# Patient Record
Sex: Female | Born: 1960 | Race: White | Hispanic: No | Marital: Married | Smoking: Never smoker
Health system: Southern US, Community
[De-identification: ages and names within clinical notes are randomized; demographics above are authoritative.]

## PROBLEM LIST (undated history)

## (undated) DIAGNOSIS — I10 Essential (primary) hypertension: Secondary | ICD-10-CM

## (undated) DIAGNOSIS — M1990 Unspecified osteoarthritis, unspecified site: Secondary | ICD-10-CM

## (undated) DIAGNOSIS — Z803 Family history of malignant neoplasm of breast: Secondary | ICD-10-CM

## (undated) DIAGNOSIS — H04123 Dry eye syndrome of bilateral lacrimal glands: Secondary | ICD-10-CM

## (undated) DIAGNOSIS — Z8041 Family history of malignant neoplasm of ovary: Secondary | ICD-10-CM

## (undated) DIAGNOSIS — Z801 Family history of malignant neoplasm of trachea, bronchus and lung: Secondary | ICD-10-CM

## (undated) DIAGNOSIS — R3129 Other microscopic hematuria: Secondary | ICD-10-CM

## (undated) DIAGNOSIS — Z800 Family history of malignant neoplasm of digestive organs: Secondary | ICD-10-CM

## (undated) DIAGNOSIS — Z8052 Family history of malignant neoplasm of bladder: Secondary | ICD-10-CM

## (undated) HISTORY — PX: TOTAL HIP ARTHROPLASTY: SHX124

## (undated) HISTORY — PX: COLONOSCOPY: SHX174

## (undated) HISTORY — DX: Unspecified osteoarthritis, unspecified site: M19.90

## (undated) HISTORY — DX: Family history of malignant neoplasm of trachea, bronchus and lung: Z80.1

## (undated) HISTORY — DX: Family history of malignant neoplasm of bladder: Z80.52

## (undated) HISTORY — DX: Family history of malignant neoplasm of ovary: Z80.41

## (undated) HISTORY — PX: WISDOM TOOTH EXTRACTION: SHX21

## (undated) HISTORY — DX: Dry eye syndrome of bilateral lacrimal glands: H04.123

## (undated) HISTORY — DX: Family history of malignant neoplasm of breast: Z80.3

## (undated) HISTORY — DX: Essential (primary) hypertension: I10

## (undated) HISTORY — DX: Other microscopic hematuria: R31.29

## (undated) HISTORY — DX: Family history of malignant neoplasm of digestive organs: Z80.0

---

## 1999-08-02 ENCOUNTER — Encounter: Payer: Self-pay | Admitting: Occupational Medicine

## 1999-08-02 ENCOUNTER — Ambulatory Visit: Admission: RE | Admit: 1999-08-02 | Discharge: 1999-08-02 | Payer: Self-pay | Admitting: Occupational Medicine

## 2000-08-05 ENCOUNTER — Other Ambulatory Visit: Admission: RE | Admit: 2000-08-05 | Discharge: 2000-08-05 | Payer: Self-pay | Admitting: Obstetrics and Gynecology

## 2002-08-11 ENCOUNTER — Other Ambulatory Visit: Admission: RE | Admit: 2002-08-11 | Discharge: 2002-08-11 | Payer: Self-pay | Admitting: Obstetrics and Gynecology

## 2002-11-11 ENCOUNTER — Other Ambulatory Visit: Admission: RE | Admit: 2002-11-11 | Discharge: 2002-11-11 | Payer: Self-pay | Admitting: Obstetrics and Gynecology

## 2003-08-13 ENCOUNTER — Other Ambulatory Visit: Admission: RE | Admit: 2003-08-13 | Discharge: 2003-08-13 | Payer: Self-pay | Admitting: Obstetrics and Gynecology

## 2004-02-22 ENCOUNTER — Encounter: Admission: RE | Admit: 2004-02-22 | Discharge: 2004-02-22 | Payer: Self-pay | Admitting: Obstetrics and Gynecology

## 2004-08-15 ENCOUNTER — Other Ambulatory Visit: Admission: RE | Admit: 2004-08-15 | Discharge: 2004-08-15 | Payer: Self-pay | Admitting: Obstetrics and Gynecology

## 2005-04-04 ENCOUNTER — Encounter: Admission: RE | Admit: 2005-04-04 | Discharge: 2005-04-04 | Payer: Self-pay | Admitting: Obstetrics and Gynecology

## 2006-01-25 ENCOUNTER — Other Ambulatory Visit: Admission: RE | Admit: 2006-01-25 | Discharge: 2006-01-25 | Payer: Self-pay | Admitting: Obstetrics and Gynecology

## 2006-05-07 ENCOUNTER — Encounter: Admission: RE | Admit: 2006-05-07 | Discharge: 2006-05-07 | Payer: Self-pay | Admitting: Obstetrics and Gynecology

## 2007-01-29 ENCOUNTER — Other Ambulatory Visit: Admission: RE | Admit: 2007-01-29 | Discharge: 2007-01-29 | Payer: Self-pay | Admitting: Obstetrics and Gynecology

## 2007-05-29 ENCOUNTER — Encounter: Admission: RE | Admit: 2007-05-29 | Discharge: 2007-05-29 | Payer: Self-pay | Admitting: Obstetrics and Gynecology

## 2008-02-09 ENCOUNTER — Other Ambulatory Visit: Admission: RE | Admit: 2008-02-09 | Discharge: 2008-02-09 | Payer: Self-pay | Admitting: Obstetrics and Gynecology

## 2008-05-31 ENCOUNTER — Encounter: Admission: RE | Admit: 2008-05-31 | Discharge: 2008-05-31 | Payer: Self-pay | Admitting: Obstetrics and Gynecology

## 2009-06-07 ENCOUNTER — Encounter: Admission: RE | Admit: 2009-06-07 | Discharge: 2009-06-07 | Payer: Self-pay | Admitting: Obstetrics and Gynecology

## 2010-06-08 ENCOUNTER — Encounter: Admission: RE | Admit: 2010-06-08 | Discharge: 2010-06-08 | Payer: Self-pay | Admitting: Obstetrics and Gynecology

## 2010-09-03 DIAGNOSIS — R3129 Other microscopic hematuria: Secondary | ICD-10-CM

## 2010-09-03 HISTORY — DX: Other microscopic hematuria: R31.29

## 2011-05-01 ENCOUNTER — Other Ambulatory Visit: Payer: Self-pay | Admitting: Obstetrics and Gynecology

## 2011-05-01 DIAGNOSIS — Z1231 Encounter for screening mammogram for malignant neoplasm of breast: Secondary | ICD-10-CM

## 2011-06-11 ENCOUNTER — Ambulatory Visit
Admission: RE | Admit: 2011-06-11 | Discharge: 2011-06-11 | Disposition: A | Discharge status: Home / Self Care | Payer: BC Managed Care – PPO | Source: Ambulatory Visit | Attending: Obstetrics and Gynecology | Admitting: Obstetrics and Gynecology

## 2011-06-11 DIAGNOSIS — Z1231 Encounter for screening mammogram for malignant neoplasm of breast: Secondary | ICD-10-CM

## 2012-06-02 ENCOUNTER — Other Ambulatory Visit: Payer: Self-pay | Admitting: Obstetrics and Gynecology

## 2012-06-02 DIAGNOSIS — Z1231 Encounter for screening mammogram for malignant neoplasm of breast: Secondary | ICD-10-CM

## 2012-06-27 ENCOUNTER — Ambulatory Visit
Admission: RE | Admit: 2012-06-27 | Discharge: 2012-06-27 | Disposition: A | Discharge status: Home / Self Care | Payer: BC Managed Care – PPO | Source: Ambulatory Visit | Attending: Obstetrics and Gynecology | Admitting: Obstetrics and Gynecology

## 2012-06-27 DIAGNOSIS — Z1231 Encounter for screening mammogram for malignant neoplasm of breast: Secondary | ICD-10-CM

## 2013-05-15 ENCOUNTER — Ambulatory Visit: Payer: Self-pay | Admitting: Obstetrics and Gynecology

## 2013-05-15 ENCOUNTER — Encounter: Payer: Self-pay | Admitting: Gynecology

## 2013-05-15 ENCOUNTER — Encounter: Payer: Self-pay | Admitting: Obstetrics and Gynecology

## 2013-05-15 ENCOUNTER — Ambulatory Visit (INDEPENDENT_AMBULATORY_CARE_PROVIDER_SITE_OTHER): Payer: BC Managed Care – PPO | Admitting: Gynecology

## 2013-05-15 VITALS — BP 110/80 | HR 80 | Resp 12 | Ht 64.0 in | Wt 154.0 lb

## 2013-05-15 DIAGNOSIS — Z79890 Hormone replacement therapy (postmenopausal): Secondary | ICD-10-CM

## 2013-05-15 DIAGNOSIS — R3129 Other microscopic hematuria: Secondary | ICD-10-CM

## 2013-05-15 DIAGNOSIS — Z01419 Encounter for gynecological examination (general) (routine) without abnormal findings: Secondary | ICD-10-CM

## 2013-05-15 DIAGNOSIS — R311 Benign essential microscopic hematuria: Secondary | ICD-10-CM | POA: Insufficient documentation

## 2013-05-15 DIAGNOSIS — I10 Essential (primary) hypertension: Secondary | ICD-10-CM

## 2013-05-15 DIAGNOSIS — Z124 Encounter for screening for malignant neoplasm of cervix: Secondary | ICD-10-CM

## 2013-05-15 NOTE — Patient Instructions (Signed)

## 2013-05-15 NOTE — Progress Notes (Signed)
52 y.o. Married Caucasian female   G2P2002 here for annual exam. Pt reports menses are absent.  She does report hot flashes, does have night sweats, does not have vaginal dryness.  She is not using lubricants.  She does not report post-menopasual bleeding.  She is happy with estrogen/progestin combination.  Pt still sees urology regarding microscopic hematuria.    LMP: 04/04/2011       Sexually active: no  The current method of family planning is none.    Exercising: yes  The patient does not participate in regular exercise at present. Last pap: 03/15/2010 Abnormal PAP: years ago Mammogram: 06/27/12 BSE: sometimes  Colonoscopy: 2010- Normal  DEXA: yes, cannot remember Alcohol: less than 1- occ Tobacco: no  Hgb: PCP ; Urine: PCP  Health Maintenance  Topic Date Due  . Pap Smear  06/04/1977  . Tetanus/tdap  06/04/1978  . Colonoscopy  06/04/2009  . Influenza Vaccine  04/03/2013  . Mammogram  06/27/2014    Family History  Problem Relation Age of Onset  . Bladder Cancer Mother 45    There are no active problems to display for this patient.   Past Medical History  Diagnosis Date  . Hypertension   . Microscopic hematuria 2012    w/u -    No past surgical history on file.  Allergies: Review of patient's allergies indicates no known allergies.  Current Outpatient Prescriptions  Medication Sig Dispense Refill  . Cyanocobalamin (B-12 SL) Place under the tongue.      . CycloSPORINE (RESTASIS OP) Apply to eye.      . Glucosamine HCl (GLUCOSAMINE PO) Take by mouth.      . losartan (COZAAR) 50 MG tablet Take 50 mg by mouth daily.      Marland Kitchen UNABLE TO FIND Med Name: Ultra Flora Plus      . estradiol (ESTRACE) 1 MG tablet daily.      . progesterone (PROMETRIUM) 100 MG capsule daily.       No current facility-administered medications for this visit.    ROS: Pertinent items are noted in HPI.  Exam:    There were no vitals taken for this visit. Weight change: @WEIGHTCHANGE @ Last 3  height recordings:  Ht Readings from Last 3 Encounters:  No data found for Ht   General appearance: alert, cooperative and appears stated age Head: Normocephalic, without obvious abnormality, atraumatic Neck: no adenopathy, no carotid bruit, no JVD, supple, symmetrical, trachea midline and thyroid not enlarged, symmetric, no tenderness/mass/nodules Lungs: clear to auscultation bilaterally Breasts: normal appearance, no masses or tenderness Heart: regular rate and rhythm, S1, S2 normal, no murmur, click, rub or gallop Abdomen: soft, non-tender; bowel sounds normal; no masses,  no organomegaly Extremities: extremities normal, atraumatic, no cyanosis or edema Skin: Skin color, texture, turgor normal. No rashes or lesions Lymph nodes: Cervical, supraclavicular, and axillary nodes normal. no inguinal nodes palpated Neurologic: Grossly normal   Pelvic: External genitalia:  no lesions              Urethra: normal appearing urethra with no masses, tenderness or lesions              Bartholins and Skenes: normal                 Vagina: normal appearing vagina with normal color and discharge, no lesions              Cervix: normal appearance and nabothian cysts  Pap taken: yes        Bimanual Exam:  Uterus:  uterus is normal size, shape, consistency and nontender                                      Adnexa:    normal adnexa in size, nontender and no masses                                      Rectovaginal: Confirms                                      Anus:  defer exam  A: well woman no contraindication to continue hormonal therapy      P: mammogram pap smear with [facility] counseled on breast self exam, mammography screening, use and side effects of HRT.  Pt interestred in trying duavee, samples given, discussed difference in this Rx compared to estrogen/progestin combination she is on.  Pt aware of increased breast cancer seen in HRT group of [facility] compared to ERT group, pt to call  with rx request return annually or prn Discussed PAP guideline changes, importance of weight bearing exercises, calcium, vit D and balanced diet.  An After Visit Summary was printed and given to the patient.

## 2013-05-19 LAB — IPS PAP TEST WITH HPV

## 2013-05-27 DIAGNOSIS — Z79890 Hormone replacement therapy (postmenopausal): Secondary | ICD-10-CM | POA: Insufficient documentation

## 2013-05-27 DIAGNOSIS — E785 Hyperlipidemia, unspecified: Secondary | ICD-10-CM | POA: Insufficient documentation

## 2013-06-01 ENCOUNTER — Other Ambulatory Visit: Payer: Self-pay

## 2013-06-01 DIAGNOSIS — Z1231 Encounter for screening mammogram for malignant neoplasm of breast: Secondary | ICD-10-CM

## 2013-06-16 ENCOUNTER — Other Ambulatory Visit: Payer: Self-pay | Admitting: Obstetrics and Gynecology

## 2013-06-29 ENCOUNTER — Ambulatory Visit
Admission: RE | Admit: 2013-06-29 | Discharge: 2013-06-29 | Disposition: A | Discharge status: Home / Self Care | Payer: BC Managed Care – PPO | Source: Ambulatory Visit

## 2013-06-29 DIAGNOSIS — Z1231 Encounter for screening mammogram for malignant neoplasm of breast: Secondary | ICD-10-CM

## 2013-07-09 ENCOUNTER — Other Ambulatory Visit: Payer: Self-pay

## 2013-07-25 ENCOUNTER — Encounter: Payer: Self-pay | Admitting: Gynecology

## 2013-07-27 ENCOUNTER — Other Ambulatory Visit: Payer: Self-pay | Admitting: Gynecology

## 2013-07-27 MED ORDER — CONJ ESTROGENS-BAZEDOXIFENE 0.45-20 MG PO TABS: 1.0000 | ORAL_TABLET | ORAL | Status: DC

## 2013-12-18 ENCOUNTER — Telehealth: Payer: Self-pay | Admitting: Gynecology

## 2013-12-18 ENCOUNTER — Ambulatory Visit (INDEPENDENT_AMBULATORY_CARE_PROVIDER_SITE_OTHER): Payer: BC Managed Care – PPO | Admitting: Certified Nurse Midwife

## 2013-12-18 ENCOUNTER — Encounter: Payer: Self-pay | Admitting: Certified Nurse Midwife

## 2013-12-18 VITALS — BP 120/64 | HR 70 | Temp 98.3°F | Resp 16 | Ht 64.0 in | Wt 157.0 lb

## 2013-12-18 DIAGNOSIS — N390 Urinary tract infection, site not specified: Secondary | ICD-10-CM

## 2013-12-18 LAB — POCT URINALYSIS DIPSTICK
BILIRUBIN UA: NEGATIVE
Glucose, UA: NEGATIVE
KETONES UA: NEGATIVE
LEUKOCYTES UA: NEGATIVE
Nitrite, UA: POSITIVE
PH UA: 5
Protein, UA: NEGATIVE
Urobilinogen, UA: NEGATIVE

## 2013-12-18 MED ORDER — NITROFURANTOIN MONOHYD MACRO 100 MG PO CAPS: 100.0000 mg | ORAL_CAPSULE | Freq: Two times a day (BID) | ORAL | Status: DC

## 2013-12-18 NOTE — Patient Instructions (Signed)
Urinary Tract Infection  Urinary tract infections (UTIs) can develop anywhere along your urinary tract. Your urinary tract is your body's drainage system for removing wastes and extra water. Your urinary tract includes two kidneys, two ureters, a bladder, and a urethra. Your kidneys are a pair of bean-shaped organs. Each kidney is about the size of your fist. They are located below your ribs, one on each side of your spine.  CAUSES  Infections are caused by microbes, which are microscopic organisms, including fungi, viruses, and bacteria. These organisms are so small that they can only be seen through a microscope. Bacteria are the microbes that most commonly cause UTIs.  SYMPTOMS   Symptoms of UTIs may vary by age and gender of the patient and by the location of the infection. Symptoms in young women typically include a frequent and intense urge to urinate and a painful, burning feeling in the bladder or urethra during urination. Older women and men are more likely to be tired, shaky, and weak and have muscle aches and abdominal pain. A fever may mean the infection is in your kidneys. Other symptoms of a kidney infection include pain in your back or sides below the ribs, nausea, and vomiting.  DIAGNOSIS  To diagnose a UTI, your caregiver will ask you about your symptoms. Your caregiver also will ask to provide a urine sample. The urine sample will be tested for bacteria and white blood cells. White blood cells are made by your body to help fight infection.  TREATMENT   Typically, UTIs can be treated with medication. Because most UTIs are caused by a bacterial infection, they usually can be treated with the use of antibiotics. The choice of antibiotic and length of treatment depend on your symptoms and the type of bacteria causing your infection.  HOME CARE INSTRUCTIONS   If you were prescribed antibiotics, take them exactly as your caregiver instructs you. Finish the medication even if you feel better after you  have only taken some of the medication.   Drink enough water and fluids to keep your urine clear or pale yellow.   Avoid caffeine, tea, and carbonated beverages. They tend to irritate your bladder.   Empty your bladder often. Avoid holding urine for long periods of time.   Empty your bladder before and after sexual intercourse.   After a bowel movement, women should cleanse from front to back. Use each tissue only once.  SEEK MEDICAL CARE IF:    You have back pain.   You develop a fever.   Your symptoms do not begin to resolve within 3 days.  SEEK IMMEDIATE MEDICAL CARE IF:    You have severe back pain or lower abdominal pain.   You develop chills.   You have nausea or vomiting.   You have continued burning or discomfort with urination.  MAKE SURE YOU:    Understand these instructions.   Will watch your condition.   Will get help right away if you are not doing well or get worse.  Document Released: 05/30/2005 Document Revised: 02/19/2012 Document Reviewed: 09/28/2011  Exit[employer] Patient Information 2014 [employer].

## 2013-12-18 NOTE — Progress Notes (Signed)
S:  53 y.o.Married Caucasian female presents with complaint of UTI. Symptoms began this am. With symptoms of burning with urination, dysuria, urinary frequency, urinary urgency. . Pertinent negatives include The patient is having no constitutional symptoms, denying fever, chills, anorexia, or weight loss.. Sexually active yes  Symptoms related to post coital No . Menopausal yes Vaginal dryness yes Same partner without change. Last UTI was E. Coli treated with Macrobid in 2009 per chart. No new personal products or vaginal dryness.  ROS: fatigued  O alert, oriented to person, place, and time   healthy, well developed and well nourished  Skin warm and dry  Mild supra pubic tenderness  No CVA tenderness bilateral  Bladder, urethra, urethral meatus tender  cervix normal in appearance, external genitalia normal, no adnexal masses or tenderness, uterus normal size, shape, and consistency and vagina normal without discharge   Diagnostic Test:    Poct urine-rbc-tr, nitrites-positive  Assessment:UTI  P: Medications: Rx Macrobid see order. Maintain adequate hydration. Follow up if symptoms not improving, and as needed.   Lab:TOC if Urine Culture is positive 2 weeks   Rv as above, prn

## 2013-12-18 NOTE — Telephone Encounter (Signed)
error 

## 2013-12-19 LAB — URINALYSIS, MICROSCOPIC ONLY
Bacteria, UA: NONE SEEN
CASTS: NONE SEEN
Crystals: NONE SEEN
Squamous Epithelial / LPF: NONE SEEN

## 2013-12-21 LAB — URINE CULTURE: Colony Count: >=100000

## 2013-12-28 NOTE — Progress Notes (Signed)
Reviewed personally.  M. Suzanne Eshani Maestre, MD.  

## 2014-01-05 ENCOUNTER — Ambulatory Visit (INDEPENDENT_AMBULATORY_CARE_PROVIDER_SITE_OTHER): Payer: BC Managed Care – PPO | Admitting: Certified Nurse Midwife

## 2014-01-05 ENCOUNTER — Encounter: Payer: Self-pay | Admitting: Certified Nurse Midwife

## 2014-01-05 VITALS — BP 130/80 | HR 80 | Temp 98.7°F | Resp 16 | Ht 64.0 in | Wt 152.4 lb

## 2014-01-05 DIAGNOSIS — N390 Urinary tract infection, site not specified: Secondary | ICD-10-CM

## 2014-01-05 LAB — POCT URINALYSIS DIPSTICK
Bilirubin, UA: NEGATIVE
Glucose, UA: NEGATIVE
KETONES UA: NEGATIVE
Leukocytes, UA: NEGATIVE
Nitrite, UA: NEGATIVE
PROTEIN UA: NEGATIVE
Urobilinogen, UA: NEGATIVE
pH, UA: 5

## 2014-01-05 NOTE — Progress Notes (Signed)
Reviewed personally.  M. Suzanne Azie Mcconahy, MD.  

## 2014-01-05 NOTE — Progress Notes (Signed)
53 y.o. Married Caucasian female G2P2002 here for follow up of Klebsiella UTI treated with Macrobid initiated on 12/18/13. Completed all medication as directed.  Denies any symptoms of urinary frequency, urgency or pain. Denies vaginal itching, burning or abnormal discharge. Still occasional "twinge", but no pain. No vaginal dryness.    O: Healthy WD,WN female Affect: normal Skin:warm and dry Abdomen:negative for suprapubic tenderness CVAT: negative bilateral Pelvic exam:EXTERNAL GENITALIA: normal appearing vulva with no masses, tenderness or lesions Bladder/urethra/urethral meatus non tender VAGINA: no abnormal discharge or lesions and scant moisture noted with slight increase pink at vaginal introitus, non tender  A:UTI probably resolved  Vaginal dryness   P: Discussed findings of no indication of continued UTI, but will send lab to confirm. Continue to increase water daily. Reviewed findings of slight vaginal dryness and need to use vaginal moisture 2 x weekly to increase vaginal moisture. Discussed option of OTC, Olive Oil. Patient would like to try Olive Oil and will advise if no change. Poct urine-rbc tr Labs Urine micro/culture    RV prn

## 2014-01-06 LAB — URINE CULTURE

## 2014-01-06 LAB — URINALYSIS, MICROSCOPIC ONLY
BACTERIA UA: NONE SEEN
CASTS: NONE SEEN
CRYSTALS: NONE SEEN
SQUAMOUS EPITHELIAL / LPF: NONE SEEN

## 2014-05-21 ENCOUNTER — Ambulatory Visit: Payer: BC Managed Care – PPO | Admitting: Gynecology

## 2014-06-09 ENCOUNTER — Encounter: Payer: Self-pay | Admitting: Gynecology

## 2014-06-09 ENCOUNTER — Ambulatory Visit (INDEPENDENT_AMBULATORY_CARE_PROVIDER_SITE_OTHER): Payer: BC Managed Care – PPO | Admitting: Gynecology

## 2014-06-09 VITALS — BP 136/70 | HR 72 | Ht 64.0 in | Wt 133.0 lb

## 2014-06-09 DIAGNOSIS — Z01419 Encounter for gynecological examination (general) (routine) without abnormal findings: Secondary | ICD-10-CM

## 2014-06-09 DIAGNOSIS — Z79890 Hormone replacement therapy (postmenopausal): Secondary | ICD-10-CM

## 2014-06-09 DIAGNOSIS — Z0000 Encounter for general adult medical examination without abnormal findings: Secondary | ICD-10-CM

## 2014-06-09 LAB — POCT URINALYSIS DIPSTICK
BILIRUBIN UA: NEGATIVE
Glucose, UA: NEGATIVE
KETONES UA: NEGATIVE
Leukocytes, UA: NEGATIVE
Nitrite, UA: NEGATIVE
PH UA: 5
Protein, UA: NEGATIVE
Urobilinogen, UA: NEGATIVE

## 2014-06-09 MED ORDER — CONJ ESTROGENS-BAZEDOXIFENE 0.45-20 MG PO TABS: 1.0000 | ORAL_TABLET | ORAL | Status: DC

## 2014-06-09 NOTE — Progress Notes (Signed)
53 y.o. married Caucasian female   G2P2002 here for annual exam. Pt reports no menses.  She does not report hot flashes, does not have night sweats, does not have vaginal dryness.  She is not using lubricants, .  She does not report post-menopasual bleeding  Patient's last menstrual period was 04/04/2011.          Sexually active: No.  The current method of family planning is post menopausal status.    Exercising: Yes.    Home exercise routine includes walking 3 mile 3 times a week. Last pap: 05/15/13 neg HR HPV Abnormal ZOX:WRUEAPAP:years ago Mammogram:07/02/13 Bi-Rads Neg VWU:[id number]:sometimes Colonoscopy: 2010-normal DEXA: yes, can't remember Alcohol:socially Tobacco:no  Health Maintenance  Topic Date Due  . Colonoscopy  06/04/2009  . Influenza Vaccine  04/03/2014  . Mammogram  06/30/2015  . Pap Smear  05/15/2016  . Tetanus/tdap  03/03/2019    Family History  Problem Relation Age of Onset  . Bladder Cancer Mother 4082  . Cancer Father   . Cancer Maternal Grandmother   . Cancer Maternal Grandfather   . Ovarian cancer Cousin   . Colon cancer Cousin   . Breast cancer Cousin     Patient Active Problem List   Diagnosis Date Noted  . Unspecified essential hypertension 05/15/2013  . Benign microscopic hematuria 05/15/2013    Past Medical History  Diagnosis Date  . Hypertension   . Microscopic hematuria 2012    w/u -    History reviewed. No pertinent past surgical history.  Allergies: Review of patient's allergies indicates no known allergies.  Current Outpatient Prescriptions  Medication Sig Dispense Refill  . Conj Estrogens-Bazedoxifene (DUAVEE) 0.45-20 MG TABS Take 1 tablet by mouth 1 day or 1 dose.  30 tablet  11  . Cyanocobalamin (B-12 SL) Place under the tongue.      . CycloSPORINE (RESTASIS OP) Apply to eye.      . Glucosamine HCl (GLUCOSAMINE PO) Take by mouth.      . losartan (COZAAR) 50 MG tablet Take 50 mg by mouth daily.      Marland Kitchen. UNABLE TO FIND Med Name: Ultra Flora Plus        No current facility-administered medications for this visit.    ROS: Pertinent items are noted in HPI.  Exam:    BP 136/70  Pulse 72  Ht 5\' 4"  (1.626 m)  Wt 133 lb (60.328 kg)  BMI 22.82 kg/m2  LMP 04/04/2011 Weight change: @WEIGHTCHANGE @ Last 3 height recordings:  Ht Readings from Last 3 Encounters:  06/09/14 5\' 4"  (1.626 m)  01/05/14 5\' 4"  (1.626 m)  12/18/13 5\' 4"  (1.626 m)   General appearance: alert, cooperative and appears stated age Head: Normocephalic, without obvious abnormality, atraumatic Neck: no adenopathy, no carotid bruit, no JVD, supple, symmetrical, trachea midline and thyroid not enlarged, symmetric, no tenderness/mass/nodules Lungs: clear to auscultation bilaterally Breasts: Inspection negative, No nipple retraction or dimpling, No nipple discharge or bleeding, No axillary or supraclavicular adenopathy, Normal to palpation without dominant masses Heart: regular rate and rhythm, S1, S2 normal, no murmur, click, rub or gallop Abdomen: soft, non-tender; bowel sounds normal; no masses,  no organomegaly Extremities: extremities normal, atraumatic, no cyanosis or edema Skin: Skin color, texture, turgor normal. No rashes or lesions Lymph nodes: Cervical, supraclavicular, and axillary nodes normal. no inguinal nodes palpated Neurologic: Grossly normal   Pelvic: External genitalia:  no lesions              Urethra: normal appearing urethra with  no masses, tenderness or lesions              Bartholins and Skenes: Bartholin's, Urethra, Skene's normal                 Vagina: normal appearing vagina with normal color and discharge, no lesions              Cervix: normal appearance              Pap taken: No.        Bimanual Exam:  Uterus:  uterus is normal size, shape, consistency and nontender                                      Adnexa:    normal adnexa in size, nontender and no masses                                      Rectovaginal: Confirms                                       Anus:  normal sphincter tone, no lesions      1. Laboratory exam ordered as part of routine general medical examination  - POCT urinalysis dipstick  2. Encounter for routine gynecological examination   mammogram Pt lost 30# with weight watchers Would like to continue with HRT-risks and benefits reviewe counseled on breast self exam, mammography screening, adequate intake of calcium and vitamin D, diet and exercise return annually or prn Discussed PAP guideline changes, importance of weight bearing exercises, calcium, vit D and balanced diet.  3. Hormone replacement: Refill duavee for 1y An After Visit Summary was printed and given to the patient.

## 2014-06-11 ENCOUNTER — Other Ambulatory Visit: Payer: Self-pay

## 2014-06-11 DIAGNOSIS — Z1231 Encounter for screening mammogram for malignant neoplasm of breast: Secondary | ICD-10-CM

## 2014-06-18 ENCOUNTER — Other Ambulatory Visit: Payer: Self-pay

## 2014-07-05 ENCOUNTER — Encounter: Payer: Self-pay | Admitting: Gynecology

## 2014-07-05 ENCOUNTER — Ambulatory Visit
Admission: RE | Admit: 2014-07-05 | Discharge: 2014-07-05 | Disposition: A | Discharge status: Home / Self Care | Payer: BC Managed Care – PPO | Source: Ambulatory Visit

## 2014-07-05 DIAGNOSIS — Z1231 Encounter for screening mammogram for malignant neoplasm of breast: Secondary | ICD-10-CM

## 2014-08-06 ENCOUNTER — Other Ambulatory Visit: Payer: Self-pay

## 2014-08-06 DIAGNOSIS — Z79890 Hormone replacement therapy (postmenopausal): Secondary | ICD-10-CM

## 2014-08-06 NOTE — Telephone Encounter (Signed)
Pt was given a year's refill on 06/09/14. Pt will call if any refills are needed

## 2014-08-11 ENCOUNTER — Other Ambulatory Visit: Payer: Self-pay | Admitting: Gynecology

## 2014-08-11 NOTE — Telephone Encounter (Signed)
[facility] states they have [facility] and have not heard back.   Fax: [phone] Phone: [phone]  bf

## 2014-08-11 NOTE — Telephone Encounter (Signed)
Medication refill request: Duavee Last AEX:  06/09/14 Next AEX: none Last MMG (if hormonal medication request): 07/05/14 BIRADS1: neg Refill authorized: 06/09/14 #30/11 [facility] [address]  Called pharmacy spoke Lurena JoinerRebecca she will call [facility] to transfer Rx.

## 2015-06-15 ENCOUNTER — Encounter: Payer: Self-pay | Admitting: Obstetrics and Gynecology

## 2015-06-15 ENCOUNTER — Ambulatory Visit (INDEPENDENT_AMBULATORY_CARE_PROVIDER_SITE_OTHER): Payer: BLUE CROSS/BLUE SHIELD | Admitting: Obstetrics and Gynecology

## 2015-06-15 VITALS — BP 120/80 | HR 60 | Resp 14 | Ht 64.25 in | Wt 144.0 lb

## 2015-06-15 DIAGNOSIS — Z01419 Encounter for gynecological examination (general) (routine) without abnormal findings: Secondary | ICD-10-CM

## 2015-06-15 DIAGNOSIS — Z79890 Hormone replacement therapy (postmenopausal): Secondary | ICD-10-CM

## 2015-06-15 MED ORDER — CONJ ESTROG-MEDROXYPROGEST ACE 0.3-1.5 MG PO TABS: 1.0000 | ORAL_TABLET | Freq: Every day | ORAL | Status: DC

## 2015-06-15 NOTE — Patient Instructions (Signed)

## 2015-06-15 NOTE — Progress Notes (Signed)
Patient ID: Kathryn Odom, female   DOB: 12-Feb-1961, 54 y.o.   MRN: [id number] 54 y.o. [id number] MarriedCaucasianF here for annual exam.   She is on duavee for vasomotor symptoms. Recently she has had a back injury was on steroids, when coming off of them she was having some vasomotor symptoms. Now fine. No vaginal bleeding. Some mild vaginal dryness. Not sexually active.     Patient's last menstrual period was 04/04/2011.          Sexually active: No.  The current method of family planning is post menopausal status.    Exercising: No.  The patient does not participate in regular exercise at present. Smoker:  no  Health Maintenance: Pap:  05-15-13 WNL NEG HR HPV History of abnormal Pap:  Yes years ago -repeat PAP was normal MMG:  07-05-14 WNL Colonoscopy:  2010 Normal BMD:   Yes but unsure when TDaP:  03-02-09  Gardasil: N/A   reports that she has never smoked. She has never used smokeless tobacco. She reports that she drinks alcohol. She reports that she does not use illicit drugs. Rare ETOH use. 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 2 daughters, youngest just finished college.   Past Medical History  Diagnosis Date  . Hypertension   . Microscopic hematuria 2012    w/u -  . Dry eyes   . Arthritis     History reviewed. No pertinent past surgical history.  Current Outpatient Prescriptions  Medication Sig Dispense Refill  . Conj Estrogens-Bazedoxifene (DUAVEE) 0.45-20 MG TABS Take 1 tablet by mouth 1 day or 1 dose. 30 tablet 11  . CycloSPORINE (RESTASIS OP) Apply to eye.    . Glucosamine HCl (GLUCOSAMINE PO) Take by mouth.    . losartan (COZAAR) 50 MG tablet Take 50 mg by mouth daily.    Marland Kitchen UNABLE TO FIND Med Name: Ultra Flora Plus     No current facility-administered medications for this visit.    Family History  Problem Relation Age of Onset  . Bladder Cancer Mother 45  . Lung cancer Father     smoker  . Cancer  Maternal Grandmother     liver  . Lung cancer Maternal Grandfather     smoker  . Ovarian cancer Cousin     paternal #1  . Colon cancer Cousin     paternal #2  . Breast cancer Cousin     maternal  3 cousins with brain aneurysms   Review of Systems  Constitutional: Negative.   HENT: Negative.   Eyes: Negative.   Respiratory: Negative.   Cardiovascular: Negative.   Gastrointestinal: Negative.   Endocrine: Negative.   Genitourinary: Negative.   Musculoskeletal: Negative.   Skin: Negative.   Allergic/Immunologic: Negative.   Neurological: Negative.   Psychiatric/Behavioral: Negative.     Exam:   BP 120/80 mmHg  Pulse 60  Resp 14  Ht 5' 4.25" (1.632 m)  Wt 144 lb (65.318 kg)  BMI 24.52 kg/m2  LMP 04/04/2011  Weight change: @ Height:   Height: 5' 4.25" (163.2 cm)  Ht Readings from Last 3 Encounters:  06/15/15 5' 4.25" (1.632 m)  06/09/14  (1.626 m)  01/05/14  (1.626 m)    General appearance: alert, cooperative and appears stated age Head: Normocephalic, without obvious abnormality, atraumatic Neck: no adenopathy, supple, symmetrical, trachea midline and thyroid normal to inspection and palpation Lungs: clear to auscultation bilaterally Breasts: normal appearance, no masses  or tenderness Heart: regular rate and rhythm Abdomen: soft, non-tender; bowel sounds normal; no masses,  no organomegaly Extremities: extremities normal, atraumatic, no cyanosis or edema Skin: Skin color, texture, turgor normal. No rashes or lesions Lymph nodes: Cervical, supraclavicular, and axillary nodes normal. No abnormal inguinal nodes palpated Neurologic: Grossly normal   Pelvic: External genitalia:  no lesions              Urethra:  normal appearing urethra with no masses, tenderness or lesions              Bartholins and Skenes: normal                 Vagina: normal appearing vagina with normal color and discharge, no lesions. Atrophic              Cervix: no  lesions               Bimanual Exam:  Uterus:  normal size, contour, position, consistency, mobility, non-tender, anteverted              Adnexa: no mass, fullness, tenderness               Rectovaginal: Confirms               Anus:  normal sphincter tone, no lesions  Chaperone was present for exam.  A:  Well Woman with normal exam  HRT, on duavee, willing to go to a lower dose. Will change to prempro  P:    No pap this year  Mammogram next month  Colonoscopy UTD  Discussed calcium and vit D  Will change to lower dose HRT  Discussed the risks of HRT, including blood clots, stroke, MI, breast cancer  Labs and immunizations with primary MD

## 2015-06-28 ENCOUNTER — Other Ambulatory Visit: Payer: Self-pay

## 2015-06-28 DIAGNOSIS — Z1231 Encounter for screening mammogram for malignant neoplasm of breast: Secondary | ICD-10-CM

## 2015-07-22 ENCOUNTER — Telehealth: Payer: Self-pay | Admitting: Obstetrics and Gynecology

## 2015-07-22 DIAGNOSIS — N950 Postmenopausal bleeding: Secondary | ICD-10-CM

## 2015-07-22 NOTE — Telephone Encounter (Signed)
Patient called and said, "I'd like to speak with the nurse. I am having some bleeding and I'm not supposed to. It may be due to a recent medication change." Paper chart to triage.

## 2015-07-22 NOTE — Telephone Encounter (Signed)
Spoke with patient. Advised of message as seen below from Dr.Silva. Patient is agreeable and verbalizes understanding. All questions answered. Advised to take 800 mg of Ibuprofen/Motrin 1 hour prior to appointment in case EMB is needed. Patient is agreeable. PUS scheduled for 11/22 at 2:30 pm with 3 pm consult with Dr.Jertson. Orders placed for precert.  Cc: Dr.Jertson Kathryn Odom  Routing to provider for final review. Patient agreeable to disposition. Will close encounter.

## 2015-07-22 NOTE — Telephone Encounter (Signed)
Bleeding may be due to a hormone therapy change, but I would like for Dr. Oscar LaJertson to have the opportunity for pelvic ultrasound and possible endometrial biopsy for the patient next week. Please explain to patient, precert, and schedule.

## 2015-07-22 NOTE — Telephone Encounter (Signed)
Spoke with patient. Patient states that she has been on Prempro for 22 days. [facility] LMP was 04/2011. States she woke up yesterday morning with bleeding like a cycle. Bleeding has continued through this morning. "I am not bleeding heavy, but I have not had any bleeding in a long time. I am wearing a thin pad." States she is changing her pad once per day. States she feels "uncomfortable" like when she used to have her period. Advised I will speak with the covering provider and return call with further recommendations. Patient is agreeable.

## 2015-07-25 ENCOUNTER — Telehealth: Payer: Self-pay | Admitting: Obstetrics and Gynecology

## 2015-07-25 NOTE — Telephone Encounter (Signed)
Spoke with patient regarding benefit for pelvic ultrasound and endometrial biopsy. Patient understood and agreeable. Patient scheduled for 07/26/15 @ 230. Reviewed arrival date/time. Patient agreeable. No further questions. Ok to close.

## 2015-07-26 ENCOUNTER — Other Ambulatory Visit: Payer: Self-pay | Admitting: Obstetrics and Gynecology

## 2015-07-26 ENCOUNTER — Ambulatory Visit (INDEPENDENT_AMBULATORY_CARE_PROVIDER_SITE_OTHER): Payer: BLUE CROSS/BLUE SHIELD | Admitting: Obstetrics and Gynecology

## 2015-07-26 ENCOUNTER — Ambulatory Visit (INDEPENDENT_AMBULATORY_CARE_PROVIDER_SITE_OTHER): Payer: BLUE CROSS/BLUE SHIELD

## 2015-07-26 ENCOUNTER — Encounter: Payer: Self-pay | Admitting: Obstetrics and Gynecology

## 2015-07-26 DIAGNOSIS — N950 Postmenopausal bleeding: Secondary | ICD-10-CM

## 2015-07-26 NOTE — Progress Notes (Signed)
     S: the patient had one day of PMP bleeding last week. She switched her HRT about 3 weeks prior. No pain.  Past Medical History  Diagnosis Date  . Hypertension   . Microscopic hematuria 2012    w/u -  . Dry eyes   . Arthritis    No past surgical history on file.  Current Outpatient Prescriptions on File Prior to Visit  Medication Sig Dispense Refill  . CycloSPORINE (RESTASIS OP) Apply to eye.    . estrogen, conjugated,-medroxyprogesterone (PREMPRO) 0.3-1.5 MG tablet Take 1 tablet by mouth daily. 90 tablet 3  . Glucosamine HCl (GLUCOSAMINE PO) Take by mouth.    . losartan (COZAAR) 50 MG tablet Take 50 mg by mouth daily.    Marland Kitchen. UNABLE TO FIND Med Name: Ultra Flora Plus     No current facility-administered medications on file prior to visit.   No Known Allergies  SH: no tobacco, ETOH rare  Review of Systems  Genitourinary:       Post menopause bleeding   All other systems reviewed and are negative.  O: General: alert, caucasian female in NAD Blood pressure 138/79, pulse 88, resp. rate 14, weight 146 lb (66.225 kg), last menstrual period 04/04/2011. Neck: supple, no thyromegaly CV: RRR, no murmur Lungs: CTAB Abd: soft, not tender, no masses, not distended Ext: not tender, no edema Neuro: grossly intact Pelvic: normal external genitalia, normal vaginal mucosa, cervix with nabothian cyst, no lesions  Sonohysterogram The procedure and risks of the procedure were reviewed with the patient, consent form was signed. A speculum was placed in the vagina and the cervix was cleansed with betadine. The sonohysterogram catheter was inserted into the uterine cavity without difficulty. Saline was infused under direct observation with the ultrasound. A large intracavitary defect was noted at the fundus, cw an endometrial polyp.The catheter was removed.    A/P 1) Postmenopausal bleeding, large endometrial polyp on sonohysterogram  -plan hysteroscopy, polypectomy, dilation and  curettage  -risks reviewed, including: bleeding, infection, uterine perforation, need to stop procedure, need for further surgery  -ACOG handouts given  2) Hypertension, controlled on medication   25 minutes was spent face to face with the patient, >50% in counseling

## 2015-08-01 NOTE — H&P (Signed)
     S: the patient had one day of PMP bleeding last week. She switched her HRT about 3 weeks prior. No pain.  Past Medical History  Diagnosis Date  . Hypertension   . Microscopic hematuria 2012    w/u -  . Dry eyes   . Arthritis    No past surgical history on file.  Current Outpatient Prescriptions on File Prior to Visit  Medication Sig Dispense Refill  . CycloSPORINE (RESTASIS OP) Apply to eye.    . estrogen, conjugated,-medroxyprogesterone (PREMPRO) 0.3-1.5 MG tablet Take 1 tablet by mouth daily. 90 tablet 3  . Glucosamine HCl (GLUCOSAMINE PO) Take by mouth.    . losartan (COZAAR) 50 MG tablet Take 50 mg by mouth daily.    . UNABLE TO FIND Med Name: Ultra Flora Plus     No current facility-administered medications on file prior to visit.   No Known Allergies  SH: no tobacco, ETOH rare  Review of Systems  Genitourinary:       Post menopause bleeding   All other systems reviewed and are negative.  O: General: alert, caucasian female in NAD Blood pressure 138/79, pulse 88, resp. rate 14, weight 146 lb (66.225 kg), last menstrual period 04/04/2011. Neck: supple, no thyromegaly CV: RRR, no murmur Lungs: CTAB Abd: soft, not tender, no masses, not distended Ext: not tender, no edema Neuro: grossly intact Pelvic: normal external genitalia, normal vaginal mucosa, cervix with nabothian cyst, no lesions  Sonohysterogram The procedure and risks of the procedure were reviewed with the patient, consent form was signed. A speculum was placed in the vagina and the cervix was cleansed with betadine. The sonohysterogram catheter was inserted into the uterine cavity without difficulty. Saline was infused under direct observation with the ultrasound. A large intracavitary defect was noted at the fundus, cw an endometrial polyp.The catheter was removed.    A/P 1) Postmenopausal bleeding, large endometrial polyp on sonohysterogram  -plan hysteroscopy, polypectomy, dilation and  curettage  -risks reviewed, including: bleeding, infection, uterine perforation, need to stop procedure, need for further surgery  -ACOG handouts given  2) Hypertension, controlled on medication   25 minutes was spent face to face with the patient, >50% in counseling 

## 2015-08-03 ENCOUNTER — Ambulatory Visit: Payer: Self-pay

## 2015-08-04 ENCOUNTER — Encounter (HOSPITAL_COMMUNITY): Payer: Self-pay

## 2015-08-04 ENCOUNTER — Encounter (HOSPITAL_COMMUNITY)
Admission: RE | Admit: 2015-08-04 | Discharge: 2015-08-04 | Disposition: A | Discharge status: Home / Self Care | Payer: BLUE CROSS/BLUE SHIELD | Source: Ambulatory Visit | Attending: Obstetrics and Gynecology | Admitting: Obstetrics and Gynecology

## 2015-08-04 DIAGNOSIS — N950 Postmenopausal bleeding: Secondary | ICD-10-CM | POA: Insufficient documentation

## 2015-08-04 DIAGNOSIS — Z01818 Encounter for other preprocedural examination: Secondary | ICD-10-CM | POA: Diagnosis present

## 2015-08-04 LAB — BASIC METABOLIC PANEL
Anion gap: 8 (ref 5–15)
BUN: 12 mg/dL (ref 6–20)
CHLORIDE: 100 mmol/L — AB (ref 101–111)
CO2: 28 mmol/L (ref 22–32)
CREATININE: 0.68 mg/dL (ref 0.44–1.00)
Calcium: 9.4 mg/dL (ref 8.9–10.3)
GFR calc Af Amer: >60 mL/min (ref >60)
GFR calc non Af Amer: >60 mL/min (ref >60)
GLUCOSE: 104 mg/dL — AB (ref 65–99)
POTASSIUM: 4.4 mmol/L (ref 3.5–5.1)
Sodium: 136 mmol/L (ref 135–145)

## 2015-08-04 LAB — CBC
HCT: 38.9 % (ref 36.0–46.0)
Hemoglobin: 12.9 g/dL (ref 12.0–15.0)
MCH: 30 pg (ref 26.0–34.0)
MCHC: 33.2 g/dL (ref 30.0–36.0)
MCV: 90.5 fL (ref 78.0–100.0)
PLATELETS: 225 10*3/uL (ref 150–400)
RBC: 4.3 MIL/uL (ref 3.87–5.11)
RDW: 13.2 % (ref 11.5–15.5)
WBC: 10.4 10*3/uL (ref 4.0–10.5)

## 2015-08-04 NOTE — Patient Instructions (Signed)
Your procedure is scheduled on:  August 16, 2015    Enter through the Main Entrance of [facility] at: 9:10 am   Pick up the phone at the desk and dial [phone].  Call this number if you have problems the morning of surgery: [phone].  Remember: Do NOT eat food: after midnight on Monday night  Do NOT drink clear liquids after: midnight on Monday night  Take these medicines the morning of surgery with a SIP OF WATER:  None   Do NOT wear jewelry (body piercing), metal hair clips/bobby pins, make-up, or nail polish. Do NOT wear lotions, powders, or perfumes.  You may wear deoderant. Do NOT shave for 48 hours prior to surgery. Do NOT bring valuables to the hospital. Contacts, dentures, or bridgework may not be worn into surgery. Have a responsible adult drive you home and stay with you for 24 hours after your procedure

## 2015-08-16 ENCOUNTER — Encounter (HOSPITAL_COMMUNITY)
Admission: RE | Disposition: A | Discharge status: Home / Self Care | Payer: Self-pay | Source: Ambulatory Visit | Attending: Obstetrics and Gynecology

## 2015-08-16 ENCOUNTER — Ambulatory Visit: Payer: Self-pay

## 2015-08-16 ENCOUNTER — Ambulatory Visit (HOSPITAL_COMMUNITY): Payer: BLUE CROSS/BLUE SHIELD | Admitting: Registered Nurse

## 2015-08-16 ENCOUNTER — Encounter (HOSPITAL_COMMUNITY): Payer: Self-pay | Admitting: Emergency Medicine

## 2015-08-16 ENCOUNTER — Ambulatory Visit (HOSPITAL_COMMUNITY)
Admission: RE | Admit: 2015-08-16 | Discharge: 2015-08-16 | Disposition: A | Discharge status: Home / Self Care | Payer: BLUE CROSS/BLUE SHIELD | Source: Ambulatory Visit | Attending: Obstetrics and Gynecology | Admitting: Obstetrics and Gynecology

## 2015-08-16 DIAGNOSIS — I10 Essential (primary) hypertension: Secondary | ICD-10-CM | POA: Diagnosis not present

## 2015-08-16 DIAGNOSIS — N950 Postmenopausal bleeding: Secondary | ICD-10-CM | POA: Diagnosis not present

## 2015-08-16 DIAGNOSIS — N840 Polyp of corpus uteri: Secondary | ICD-10-CM | POA: Diagnosis not present

## 2015-08-16 HISTORY — PX: DILATATION & CURETTAGE/HYSTEROSCOPY WITH MYOSURE: SHX6511

## 2015-08-16 SURGERY — DILATATION & CURETTAGE/HYSTEROSCOPY WITH MYOSURE
Anesthesia: General

## 2015-08-16 MED ORDER — LACTATED RINGERS IV SOLN
INTRAVENOUS | Status: DC
  Administered 2015-08-16 (×2): via INTRAVENOUS

## 2015-08-16 MED ORDER — FENTANYL CITRATE (PF) 100 MCG/2ML IJ SOLN
INTRAMUSCULAR | Status: AC
Start: 2015-08-16 — End: 2015-08-16
  Filled 2015-08-16: qty 2

## 2015-08-16 MED ORDER — KETOROLAC TROMETHAMINE 30 MG/ML IJ SOLN
INTRAMUSCULAR | Status: DC | PRN
  Administered 2015-08-16: 30 mg via INTRAVENOUS

## 2015-08-16 MED ORDER — MIDAZOLAM HCL 2 MG/2ML IJ SOLN
INTRAMUSCULAR | Status: AC
  Filled 2015-08-16: qty 2

## 2015-08-16 MED ORDER — LIDOCAINE HCL (CARDIAC) 20 MG/ML IV SOLN
INTRAVENOUS | Status: AC
  Filled 2015-08-16: qty 5

## 2015-08-16 MED ORDER — MIDAZOLAM HCL 5 MG/5ML IJ SOLN
INTRAMUSCULAR | Status: DC | PRN
  Administered 2015-08-16: 2 mg via INTRAVENOUS

## 2015-08-16 MED ORDER — HYDROCODONE-ACETAMINOPHEN 7.5-325 MG PO TABS: 1.0000 | ORAL_TABLET | Freq: Once | ORAL | Status: DC | PRN

## 2015-08-16 MED ORDER — ONDANSETRON HCL 4 MG/2ML IJ SOLN
INTRAMUSCULAR | Status: DC | PRN
  Administered 2015-08-16: 4 mg via INTRAVENOUS

## 2015-08-16 MED ORDER — MEPERIDINE HCL 25 MG/ML IJ SOLN: 6.2500 mg | INTRAMUSCULAR | Status: DC | PRN

## 2015-08-16 MED ORDER — SCOPOLAMINE 1 MG/3DAYS TD PT72
1.0000 | MEDICATED_PATCH | Freq: Once | TRANSDERMAL | Status: DC
  Administered 2015-08-16: 1.5 mg via TRANSDERMAL

## 2015-08-16 MED ORDER — DEXAMETHASONE SODIUM PHOSPHATE 4 MG/ML IJ SOLN
INTRAMUSCULAR | Status: DC | PRN
  Administered 2015-08-16: 4 mg via INTRAVENOUS

## 2015-08-16 MED ORDER — FENTANYL CITRATE (PF) 100 MCG/2ML IJ SOLN: 25.0000 ug | INTRAMUSCULAR | Status: DC | PRN

## 2015-08-16 MED ORDER — ONDANSETRON HCL 4 MG/2ML IJ SOLN
INTRAMUSCULAR | Status: AC
  Filled 2015-08-16: qty 2

## 2015-08-16 MED ORDER — LACTATED RINGERS IV SOLN: INTRAVENOUS | Status: DC

## 2015-08-16 MED ORDER — SCOPOLAMINE 1 MG/3DAYS TD PT72
MEDICATED_PATCH | TRANSDERMAL | Status: AC
  Administered 2015-08-16: 1.5 mg via TRANSDERMAL
  Filled 2015-08-16: qty 1

## 2015-08-16 MED ORDER — FENTANYL CITRATE (PF) 100 MCG/2ML IJ SOLN
INTRAMUSCULAR | Status: DC | PRN
  Administered 2015-08-16: 50 ug via INTRAVENOUS
  Administered 2015-08-16 (×2): 25 ug via INTRAVENOUS

## 2015-08-16 MED ORDER — LIDOCAINE HCL (CARDIAC) 20 MG/ML IV SOLN
INTRAVENOUS | Status: DC | PRN
  Administered 2015-08-16: 50 mg via INTRAVENOUS

## 2015-08-16 MED ORDER — KETOROLAC TROMETHAMINE 30 MG/ML IJ SOLN
INTRAMUSCULAR | Status: AC
  Filled 2015-08-16: qty 1

## 2015-08-16 MED ORDER — PROPOFOL 10 MG/ML IV BOLUS
INTRAVENOUS | Status: AC
  Filled 2015-08-16: qty 20

## 2015-08-16 MED ORDER — DEXAMETHASONE SODIUM PHOSPHATE 4 MG/ML IJ SOLN
INTRAMUSCULAR | Status: AC
  Filled 2015-08-16: qty 1

## 2015-08-16 MED ORDER — PROPOFOL 10 MG/ML IV BOLUS
INTRAVENOUS | Status: DC | PRN
  Administered 2015-08-16: 170 mg via INTRAVENOUS

## 2015-08-16 MED ORDER — METOCLOPRAMIDE HCL 5 MG/ML IJ SOLN: 10.0000 mg | Freq: Once | INTRAMUSCULAR | Status: DC | PRN

## 2015-08-16 SURGICAL SUPPLY — 19 items
CANISTER SUCT 3000ML (MISCELLANEOUS) ×3 IMPLANT
CATH ROBINSON RED A/P 16FR (CATHETERS) ×1 IMPLANT
CLOTH BEACON ORANGE TIMEOUT ST (SAFETY) ×3 IMPLANT
CONTAINER PREFILL 10% NBF 60ML (FORM) ×6 IMPLANT
DEVICE MYOSURE LITE (MISCELLANEOUS) IMPLANT
DEVICE MYOSURE REACH (MISCELLANEOUS) ×2 IMPLANT
FILTER ARTHROSCOPY CONVERTOR (FILTER) ×3 IMPLANT
GLOVE BIO SURGEON STRL SZ 6.5 (GLOVE) ×2 IMPLANT
GLOVE BIO SURGEONS STRL SZ 6.5 (GLOVE) ×1
GLOVE BIOGEL PI IND STRL 7.0 (GLOVE) ×2 IMPLANT
GLOVE BIOGEL PI INDICATOR 7.0 (GLOVE) ×4
GOWN STRL REUS W/TWL LRG LVL3 (GOWN DISPOSABLE) ×6 IMPLANT
PACK VAGINAL MINOR WOMEN LF (CUSTOM PROCEDURE TRAY) ×3 IMPLANT
PAD OB MATERNITY 4.3X12.25 (PERSONAL CARE ITEMS) ×3 IMPLANT
SEAL ROD LENS SCOPE MYOSURE (ABLATOR) ×3 IMPLANT
TOWEL OR 17X24 6PK STRL BLUE (TOWEL DISPOSABLE) ×6 IMPLANT
TUBING AQUILEX INFLOW (TUBING) ×3 IMPLANT
TUBING AQUILEX OUTFLOW (TUBING) ×3 IMPLANT
WATER STERILE IRR 1000ML POUR (IV SOLUTION) ×3 IMPLANT

## 2015-08-16 NOTE — Op Note (Signed)
Preoperative Diagnosis: Postmenopausal bleeding  Postoperative Diagnosis: Same  Procedure: Hysteroscopy, polypectomy, dilation and curettage  Surgeon: Dr Gertie ExonJill Sanvi Odom  Assistants: None  Anesthesia: General via LMA  EBL: 5 cc  Fluids: 1,200 cc LR  Fluid deficit: 93 cc  Urine output: Not recorded  Indications for surgery: The patient is a 54 yo female, who presented with postmenopausal bleeding. Work up included a sonohysterogram that revealed an endometrial polyp The risks of the surgery were reviewed with the patient and the consent form was signed prior to her surgery.  Findings: Normal sized uterus and adnexa. Hysteroscopy: large endometrial polyp, normal tubal ostia, atrophic appearing endometrium  Specimens: endometrial polyp, endometrial curettings   Procedure: The patient was taken to the operating room with an IV in place. She was placed in the dorsal lithotomy position and anesthesia was administered. She was prepped and draped in the usual sterile fashion for a vaginal procedure. She voided on the way to the OR. A weighted speculum was placed in the vagina and a single tooth tenaculum was placed on the anterior lip of the cervix. The cervix was dilated to a # 7 hagar dilator. The uterus was sounded to 7 cm. The myosure hysteroscope was inserted into the uterine cavity. With continuous infusion of normal saline, the uterine cavity was visualized with the above findings. The myosure resectoscope was used to remove the endometrial polyp. The myosure was then removed. The cavity was then curetted with the small sharp curette. The cavity had the characteristically gritty texture at the end of the procedure. The curette and the single tooth tenaculum were removed. Slight oozing from the tenaculum site was stopped with pressure. The speculum was removed. The patients perineum was cleansed of betadine and she was taken out of the dorsal lithotomy position.  Upon awakening the LMA was  removed and the patient was transferred to the recovery room in stable and awake condition.  The sponge and instrument count were correct. There were no complications.

## 2015-08-16 NOTE — Anesthesia Postprocedure Evaluation (Signed)
Anesthesia Post Note  Patient: Polo RileyRobin R Switzer  Procedure(s) Performed: Procedure(s) (LRB): DILATATION & CURETTAGE/HYSTEROSCOPY WITH MYOSURE (N/A)  Patient location during evaluation: PACU Anesthesia Type: General Level of consciousness: awake and alert Pain management: pain level controlled Vital Signs Assessment: post-procedure vital signs reviewed and stable Respiratory status: spontaneous breathing, nonlabored ventilation and respiratory function stable Cardiovascular status: blood pressure returned to baseline and stable Postop Assessment: no signs of nausea or vomiting and adequate PO intake Anesthetic complications: no    Last Vitals:  Filed Vitals:   08/16/15 1245 08/16/15 1330  BP: 113/54 128/56  Pulse: 53 55  Temp:  37.1 C  Resp: 14 16    Last Pain:  Filed Vitals:   08/16/15 1339  PainSc: 3                  Aseret Hoffman A.

## 2015-08-16 NOTE — Anesthesia Preprocedure Evaluation (Signed)
Anesthesia Evaluation  Patient identified by MRN, date of birth, ID band Patient awake    Reviewed: Allergy & Precautions, NPO status , Patient's Chart, lab work & pertinent test results, reviewed documented beta blocker date and time   Airway Mallampati: II  TM Distance: >3 FB Neck ROM: Full    Dental no notable dental hx. (+) Caps, Teeth Intact   Pulmonary neg pulmonary ROS,    Pulmonary exam normal breath sounds clear to auscultation       Cardiovascular hypertension, Pt. on medications Normal cardiovascular exam Rhythm:Regular Rate:Normal     Neuro/Psych negative neurological ROS  negative psych ROS   GI/Hepatic negative GI ROS, Neg liver ROS,   Endo/Other  negative endocrine ROS  Renal/GU negative Renal ROS     Musculoskeletal  (+) Arthritis , Osteoarthritis,    Abdominal   Peds  Hematology negative hematology ROS (+)   Anesthesia Other Findings   Reproductive/Obstetrics PMB Endometrial polyp                             Anesthesia Physical Anesthesia Plan  ASA: II  Anesthesia Plan: General   Post-op Pain Management:    Induction: Intravenous  Airway Management Planned: LMA  Additional Equipment:   Intra-op Plan:   Post-operative Plan: Extubation in OR  Informed Consent: I have reviewed the patients History and Physical, chart, labs and discussed the procedure including the risks, benefits and alternatives for the proposed anesthesia with the patient or authorized representative who has indicated his/her understanding and acceptance.   Dental advisory given  Plan Discussed with: Anesthesiologist, CRNA and Surgeon  Anesthesia Plan Comments:         Anesthesia Quick Evaluation

## 2015-08-16 NOTE — Discharge Instructions (Signed)

## 2015-08-16 NOTE — Interval H&P Note (Signed)
History and Physical Interval Note:  08/16/2015 10:27 AM  Kathryn Odom  has presented today for surgery, with the diagnosis of PMB  The various methods of treatment have been discussed with the patient and family. After consideration of risks, benefits and other options for treatment, the patient has consented to  Procedure(s): DILATATION & CURETTAGE/HYSTEROSCOPY WITH MYOSURE (N/A) as a surgical intervention .  The patient's history has been reviewed, patient examined, no change in status, stable for surgery.  I have reviewed the patient's chart and labs.  Questions were answered to the patient's satisfaction.     Romualdo BolkJill Evelyn Zarai Orsborn

## 2015-08-16 NOTE — Transfer of Care (Signed)
Immediate Anesthesia Transfer of Care Note  Patient: Kathryn Odom  Procedure(s) Performed: Procedure(s): DILATATION & CURETTAGE/HYSTEROSCOPY WITH MYOSURE (N/A)  Patient Location: PACU  Anesthesia Type:General  Level of Consciousness: awake  Airway & Oxygen Therapy: Patient Spontanous Breathing and Patient connected to nasal cannula oxygen  Post-op Assessment: Report given to RN and Post -op Vital signs reviewed and stable  Post vital signs: stable  Last Vitals:  Filed Vitals:   08/16/15 0941  BP: 135/63  Pulse: 79  Temp: 36.8 C  Resp: 18    Complications: No apparent anesthesia complications

## 2015-08-16 NOTE — Anesthesia Procedure Notes (Signed)
Procedure Name: LMA Insertion Date/Time: 08/16/2015 11:02 AM Performed by: Jhonnie GarnerMARSHALL, Jarelle Ates M Pre-anesthesia Checklist: Emergency Drugs available, Patient identified, Suction available and Patient being monitored Patient Re-evaluated:Patient Re-evaluated prior to inductionOxygen Delivery Method: Circle system utilized Preoxygenation: Pre-oxygenation with 100% oxygen Intubation Type: IV induction Ventilation: Mask ventilation without difficulty LMA: LMA inserted LMA Size: 4.0 Number of attempts: 1 Placement Confirmation: positive ETCO2 and breath sounds checked- equal and bilateral Tube secured with: Tape Dental Injury: Teeth and Oropharynx as per pre-operative assessment

## 2015-08-17 ENCOUNTER — Encounter (HOSPITAL_COMMUNITY): Payer: Self-pay | Admitting: Obstetrics and Gynecology

## 2015-08-31 ENCOUNTER — Ambulatory Visit (INDEPENDENT_AMBULATORY_CARE_PROVIDER_SITE_OTHER): Payer: BLUE CROSS/BLUE SHIELD | Admitting: Obstetrics and Gynecology

## 2015-08-31 ENCOUNTER — Encounter: Payer: Self-pay | Admitting: Obstetrics and Gynecology

## 2015-08-31 VITALS — BP 110/60 | HR 80 | Resp 16 | Wt 146.0 lb

## 2015-08-31 DIAGNOSIS — Z9889 Other specified postprocedural states: Secondary | ICD-10-CM | POA: Diagnosis not present

## 2015-08-31 NOTE — Progress Notes (Signed)
Patient ID: Kathryn Odom, female   DOB: [birth year], 54 y.o.   MRN: [id number] GYNECOLOGY  VISIT   HPI: 54 y.o.   Married  Caucasian  female   G2P2002 with Patient's last menstrual period was 04/04/2011.   here for 15 day post opt D&C hysteroscopy with polypectomy. Pathology was benign polyp and atrophic endometrium. She has recovered fine, no c/o. Spotted for about a week. No pain. She is on HRT.  GYNECOLOGIC HISTORY: Patient's last menstrual period was 04/04/2011. Contraception: post menopause  Menopausal hormone therapy: Prempro         OB History    Gravida Para Term Preterm AB TAB SAB Ectopic Multiple Living   Patient Active Problem List   Diagnosis Date Noted  . Unspecified essential hypertension 05/15/2013  . Benign microscopic hematuria 05/15/2013    Past Medical History  Diagnosis Date  . Hypertension   . Microscopic hematuria 2012    w/u -  . Dry eyes   . Arthritis     Past Surgical History  Procedure Laterality Date  . Colonoscopy    . Wisdom tooth extraction    . Dilatation & curettage/hysteroscopy with myosure N/A 08/16/2015    Procedure: DILATATION & CURETTAGE/HYSTEROSCOPY WITH MYOSURE;  Surgeon: Romualdo Bolk, MD;  Location: [facility];  Service: Gynecology;  Laterality: N/A;    Current Outpatient Prescriptions  Medication Sig Dispense Refill  . cycloSPORINE (RESTASIS) 0.05 % ophthalmic emulsion Place 1 drop into both eyes 2 (two) times daily.    Marland Kitchen estrogen, conjugated,-medroxyprogesterone (PREMPRO) 0.3-1.5 MG tablet Take 1 tablet by mouth daily. 90 tablet 3  . losartan (COZAAR) 50 MG tablet Take 50 mg by mouth daily.    . Misc Natural Products (GLUCOSAMINE CHONDROITIN VIT D3 PO) Take 3 tablets by mouth daily.    Marland Kitchen OVER THE COUNTER MEDICATION Take 2 capsules by mouth daily. Ultra Flora Balance Probiotic     No current facility-administered medications for this visit.     ALLERGIES: Review of patient's allergies indicates no  known allergies.  Family History  Problem Relation Age of Onset  . Bladder Cancer Mother 47  . Lung cancer Father     smoker  . Cancer Maternal Grandmother     liver  . Lung cancer Maternal Grandfather     smoker  . Ovarian cancer Cousin     paternal #1  . Colon cancer Cousin     paternal #2  . Breast cancer Cousin     maternal    Social History   Social History  . Marital Status: Married    Spouse Name: N/A  . Number of Children: N/A  . Years of Education: N/A   Occupational History  . Not on file.   Social History Main Topics  . Smoking status: Never Smoker   . Smokeless tobacco: Never Used  . Alcohol Use: 0.0 oz/week    0 Standard drinks or equivalent per week     Comment: Occassionally  . Drug Use: No  . Sexual Activity: No   Other Topics Concern  . Not on file   Social History Narrative    Review of Systems  Constitutional: Negative.   HENT: Negative.   Eyes: Negative.   Respiratory: Negative.   Cardiovascular: Negative.   Gastrointestinal: Negative.   Genitourinary: Negative.   Musculoskeletal: Negative.   Skin: Negative.   Neurological: Negative.  Endo/Heme/Allergies: Negative.   Psychiatric/Behavioral: Negative.     PHYSICAL EXAMINATION:    BP 110/60 mmHg  Pulse 80  Resp 16  Wt 146 lb (66.225 kg)  LMP 04/04/2011    General appearance: alert, cooperative and appears stated age Abdomen: soft, non-tender; bowel sounds normal; no masses,  no organomegaly    ASSESSMENT S/P hysteroscopy, polypectomy, D&C with negative pathology   PLAN Routine f/u   An After Visit Summary was printed and given to the patient.

## 2015-09-08 ENCOUNTER — Ambulatory Visit
Admission: RE | Admit: 2015-09-08 | Discharge: 2015-09-08 | Disposition: A | Discharge status: Home / Self Care | Payer: BLUE CROSS/BLUE SHIELD | Source: Ambulatory Visit

## 2015-09-08 DIAGNOSIS — Z1231 Encounter for screening mammogram for malignant neoplasm of breast: Secondary | ICD-10-CM

## 2016-05-04 ENCOUNTER — Telehealth: Payer: Self-pay | Admitting: Obstetrics and Gynecology

## 2016-05-04 NOTE — Telephone Encounter (Signed)
Patient has some questions for the nurse no information given.

## 2016-05-04 NOTE — Telephone Encounter (Signed)
Message left to return call to Triage Nurse at [phone].    

## 2016-05-18 NOTE — Telephone Encounter (Signed)
Left message to call Chailyn Racette at [phone].  

## 2016-05-23 NOTE — Telephone Encounter (Signed)
Will close the encounter  

## 2016-05-23 NOTE — Telephone Encounter (Signed)
Dr. Oscar LaJertson, patient called office 05/04/16 as seen below. Attempted call back x2. OK to close encounter?

## 2016-06-20 ENCOUNTER — Encounter: Payer: Self-pay | Admitting: Obstetrics and Gynecology

## 2016-06-20 ENCOUNTER — Ambulatory Visit (INDEPENDENT_AMBULATORY_CARE_PROVIDER_SITE_OTHER): Payer: BLUE CROSS/BLUE SHIELD | Admitting: Obstetrics and Gynecology

## 2016-06-20 VITALS — BP 138/72 | HR 80 | Resp 14 | Ht 64.0 in | Wt 154.0 lb

## 2016-06-20 DIAGNOSIS — N952 Postmenopausal atrophic vaginitis: Secondary | ICD-10-CM

## 2016-06-20 DIAGNOSIS — Z01419 Encounter for gynecological examination (general) (routine) without abnormal findings: Secondary | ICD-10-CM | POA: Diagnosis not present

## 2016-06-20 DIAGNOSIS — Z124 Encounter for screening for malignant neoplasm of cervix: Secondary | ICD-10-CM | POA: Diagnosis not present

## 2016-06-20 DIAGNOSIS — N941 Unspecified dyspareunia: Secondary | ICD-10-CM

## 2016-06-20 DIAGNOSIS — Z79890 Hormone replacement therapy (postmenopausal): Secondary | ICD-10-CM

## 2016-06-20 DIAGNOSIS — R6882 Decreased libido: Secondary | ICD-10-CM | POA: Diagnosis not present

## 2016-06-20 MED ORDER — ESTRADIOL 10 MCG VA TABS: ORAL_TABLET | VAGINAL | 4 refills | Status: DC

## 2016-06-20 MED ORDER — CONJ ESTROG-MEDROXYPROGEST ACE 0.3-1.5 MG PO TABS: 1.0000 | ORAL_TABLET | Freq: Every day | ORAL | 3 refills | Status: DC

## 2016-06-20 NOTE — Patient Instructions (Signed)

## 2016-06-20 NOTE — Progress Notes (Signed)
55 y.o. [id number] MarriedCaucasianF here for annual exam.  No bleeding. Hasn't been sexually active for a while, they want to be. They have tried to have sex, but it hurts on entry. Using a lubricant. Has issues with low libido. Able to orgasm.  Job is very stressful. Maybe with low level depression. She is on low dose HRT, no hot flashes, gets hot at night some, mostly sleeping well.     Patient's last menstrual period was 04/04/2011.          Sexually active: Yes.    The current method of family planning is post menopausal status.    Exercising: No.  The patient does not participate in regular exercise at present. Smoker:  no  Health Maintenance: Pap:  05-15-13 WNK NEG HR HPV History of abnormal Pap:  yes MMG:  09-08-15 WNL Colonoscopy:  2010 WNL per patient  BMD:   Years ago- WNL per patient  TDaP:  03-02-09 Gardasil: N/A   reports that she has never smoked. She has never used smokeless tobacco. She reports that she drinks alcohol. She reports that she does not use drugs. Director of business planning. 2 daughters, both out of college. Occasional ETOH  Past Medical History:  Diagnosis Date  . Arthritis   . Dry eyes   . Hypertension   . Microscopic hematuria 2012   w/u -    Past Surgical History:  Procedure Laterality Date  . COLONOSCOPY    . DILATATION & CURETTAGE/HYSTEROSCOPY WITH MYOSURE N/A 08/16/2015   Procedure: DILATATION & CURETTAGE/HYSTEROSCOPY WITH MYOSURE;  Surgeon: Romualdo Bolk, MD;  Location: [facility];  Service: Gynecology;  Laterality: N/A;  . WISDOM TOOTH EXTRACTION      Current Outpatient Prescriptions  Medication Sig Dispense Refill  . cycloSPORINE (RESTASIS) 0.05 % ophthalmic emulsion Place 1 drop into both eyes 2 (two) times daily.    Marland Kitchen estrogen, conjugated,-medroxyprogesterone (PREMPRO) 0.3-1.5 MG tablet Take 1 tablet by mouth daily. 90 tablet 3  . losartan (COZAAR) 50 MG tablet Take 50 mg by mouth daily.    . Misc Natural Products (GLUCOSAMINE  CHONDROITIN VIT D3 PO) Take 3 tablets by mouth daily.    Marland Kitchen OVER THE COUNTER MEDICATION Take 2 capsules by mouth daily. Ultra Flora Balance Probiotic     No current facility-administered medications for this visit.     Family History  Problem Relation Age of Onset  . Bladder Cancer Mother 35  . Lung cancer Father     smoker  . Ovarian cancer Cousin     paternal #1  . Colon cancer Cousin     paternal #2  . Breast cancer Cousin     maternal  . Cancer Maternal Grandmother     liver  . Lung cancer Maternal Grandfather     smoker    Review of Systems  Constitutional: Negative.   HENT: Negative.   Eyes: Negative.   Respiratory: Negative.   Cardiovascular: Negative.   Gastrointestinal: Negative.   Endocrine: Negative.   Genitourinary: Negative.        Vaginal dryness Painful intercourse   Musculoskeletal: Negative.   Skin: Negative.   Allergic/Immunologic: Negative.   Neurological: Negative.   Psychiatric/Behavioral: Negative.     Exam:   BP 138/72 (BP Location: Right Arm, Patient Position: Sitting, Cuff Size: Normal)   Pulse 80   Resp 14   Ht 5\' 4"  (1.626 m)   Wt 154 lb (69.9 kg)   LMP 04/04/2011   BMI 26.43 kg/m  Weight change: @WEIGHTCHANGE @ Height:   Height: 5\' 4"  (162.6 cm)  Ht Readings from Last 3 Encounters:  06/20/16 5\' 4"  (1.626 m)  08/04/15 5\' 4"  (1.626 m)  06/15/15 5' 4.25" (1.632 m)    General appearance: alert, cooperative and appears stated age Head: Normocephalic, without obvious abnormality, atraumatic Neck: no adenopathy, supple, symmetrical, trachea midline and thyroid normal to inspection and palpation Lungs: clear to auscultation bilaterally Breasts: normal appearance, no masses or tenderness Heart: regular rate and rhythm Abdomen: soft, non-tender; bowel sounds normal; no masses,  no organomegaly Extremities: extremities normal, atraumatic, no cyanosis or edema Skin: Skin color, texture, turgor normal. No rashes or lesions Lymph nodes:  Cervical, supraclavicular, and axillary nodes normal. No abnormal inguinal nodes palpated Neurologic: Grossly normal   Pelvic: External genitalia:  no lesions              Urethra:  normal appearing urethra with no masses, tenderness or lesions              Bartholins and Skenes: normal                 Vagina: normal appearing atrophicvagina with normal color and discharge, no lesions   Able to insert 2 fingers vaginally about 75% before it is tight.               Cervix: no lesions               Bimanual Exam:  Uterus:  normal size, contour, position, consistency, mobility, non-tender              Adnexa: no mass, fullness, tenderness               Rectovaginal: Confirms               Anus:  normal sphincter tone, no lesions  Chaperone was present for exam.  A:  Well Woman with normal exam  Vaginal atrophy  Dyspareunia  Low libido  HRT, very low dose, wants to continue for now. May try to wean herself off  Under lots of stress, dysthymia   P:   Continue HRT (aware of risks)  Start generic vagifem  If still having issues we discussed vaginal dilators  Libido information given  Labs with primary  Discussed breast self exam  Discussed calcium and vit D intake  Pap with hpv  Mammogram and colonoscopy UTD  Discussed the option of trying a low dose SSRI, she will call if she wants to start  F/U in 1 month

## 2016-06-22 ENCOUNTER — Telehealth: Payer: Self-pay | Admitting: Obstetrics and Gynecology

## 2016-06-22 NOTE — Telephone Encounter (Signed)
Spoke with patient. Advised of message as seen below from Dr.Silva. Patient is agreeable and verbalizes understanding.   Cc: Dr.Jertson for further review upon return to the office Monday

## 2016-06-22 NOTE — Telephone Encounter (Signed)
I would recommend rinsing the best she can with tap water. Stop using the vagifem.  If burning persists, office visit.  If develops painful urination during the weekend, to urgent care.  Dr. Oscar LaJertson will review her chart upon her return and determine the best substitute for vaginal atrophy.   Cc- Dr. Oscar LaJertson

## 2016-06-22 NOTE — Telephone Encounter (Signed)
Patient wants to speak with the nurse. No information given. °

## 2016-06-22 NOTE — Telephone Encounter (Signed)
Spoke with patient. Patient was seen in the office on 06/20/2016 with Dr.Jertson. Patient was prescribed vagifem for vaginal atrophy and dyspareunia. Reports she placed first vagifem tablet vaginally last night before bed. Woke up this morning with internal vaginal burning. "It feels like when you have burning with a UTI, but I think it is from the medicine. I was not having any symptoms before using it." Denies burning with urination, lower back pain, fever, chills, urinary frequency, and urinary urgency. Has not used any new soap or detergents. Patient is requesting recommendations for relief and if she should continue using vagifem. Advised I will speak with covering provider Dr.Silva as Dr.Jertson is out of the office today and return call. Patient is agreeable.

## 2016-06-25 NOTE — Telephone Encounter (Signed)
Patient called and said, "My symptoms have not gotten any better after stopping the medication. I think I may have a uti."  Patient is available to come in no earlier than 4:30 PM today. She declined earlier appointments today and requested to speak with the nurse.

## 2016-06-25 NOTE — Telephone Encounter (Signed)
Spoke with patient. Patient states that she discontinued use of Vagifem after using on 06/21/2016. Took AZO on 10/20 and 10/21 with symptom relief. Discontinued use of AZO on 10/22 and symptoms returned. Reports urinary urgency and discomfort. Denies lower back pain, fever, or chills. Advised she will need to be seen in the office for further evaluation. Requesting an appointment after 4:30 pm today. Advised our last OV is at 4 pm. Patient declines. Requesting to schedule an appointment for tomorrow. Appointment scheduled for 06/26/2016 at 10 am with Leota Sauerseborah Leonard CNM. Patient is agreeable to date, time, and to see another provider as Dr.Jertson will be out of the office tomorrow morning. Advised if she develops new or worsening symptoms she will need to be seen at a local Urgent Care or ER. Patient verbalizes understanding.  Cc: Leota Sauerseborah Leonard CNM   Routing to provider for final review. Patient agreeable to disposition. Will close encounter.

## 2016-06-26 ENCOUNTER — Ambulatory Visit: Payer: BLUE CROSS/BLUE SHIELD | Admitting: Certified Nurse Midwife

## 2016-06-26 ENCOUNTER — Telehealth: Payer: Self-pay | Admitting: Certified Nurse Midwife

## 2016-06-26 LAB — IPS PAP TEST WITH HPV

## 2016-06-26 NOTE — Telephone Encounter (Signed)
Patient called and cancelled her appointment for today with Leota Sauerseborah Leonard, CNM for a possible uti. She reports she ended up going to urgent care last night to be seen instead. FYI only. Please close encounter if no further follow up is needed.

## 2016-07-18 ENCOUNTER — Ambulatory Visit: Payer: BLUE CROSS/BLUE SHIELD | Admitting: Obstetrics and Gynecology

## 2016-07-24 ENCOUNTER — Ambulatory Visit (INDEPENDENT_AMBULATORY_CARE_PROVIDER_SITE_OTHER): Payer: BLUE CROSS/BLUE SHIELD | Admitting: Obstetrics and Gynecology

## 2016-07-24 ENCOUNTER — Encounter: Payer: Self-pay | Admitting: Obstetrics and Gynecology

## 2016-07-24 VITALS — BP 130/70 | HR 80 | Resp 13 | Wt 155.0 lb

## 2016-07-24 DIAGNOSIS — R3915 Urgency of urination: Secondary | ICD-10-CM

## 2016-07-24 DIAGNOSIS — N952 Postmenopausal atrophic vaginitis: Secondary | ICD-10-CM

## 2016-07-24 DIAGNOSIS — N898 Other specified noninflammatory disorders of vagina: Secondary | ICD-10-CM | POA: Diagnosis not present

## 2016-07-24 DIAGNOSIS — F418 Other specified anxiety disorders: Secondary | ICD-10-CM

## 2016-07-24 DIAGNOSIS — N941 Unspecified dyspareunia: Secondary | ICD-10-CM

## 2016-07-24 LAB — POCT URINALYSIS DIPSTICK
Bilirubin, UA: NEGATIVE
GLUCOSE UA: NEGATIVE
Ketones, UA: NEGATIVE
Leukocytes, UA: NEGATIVE
NITRITE UA: NEGATIVE
Protein, UA: NEGATIVE
UROBILINOGEN UA: NEGATIVE
pH, UA: 6.5

## 2016-07-24 MED ORDER — CITALOPRAM HYDROBROMIDE 20 MG PO TABS: ORAL_TABLET | ORAL | 1 refills | Status: DC

## 2016-07-24 NOTE — Progress Notes (Signed)
GYNECOLOGY  VISIT   HPI: 55 y.o.   Married  Caucasian  female   G2P2002 with Patient's last menstrual period was 04/04/2011.   here for follow up vaginal atrophy and dyspareunia. She was started on vagifem one month ago. She hasn't been taking it. The morning after she took it the first time, she had terrible UTI symptoms. She was treated for a UTI on 06/25/16.  She still occasional twinge in the vagina, not sure if it's related to her bladder or vagina.  She feels some vaginal irritation, no itching, no d/c, no odor. Some urgency to void, no frequency, no pain. She hasn't tried the the vagifem again.  She would like to have her urine checked. She has a h/o hematuria, has seen hematology 2 different times, negative evaluation.  She is under a lot of stress at work. Feels anxiety and mild depression. Previously we had discussed the option of medication. She would like to try medication.  GYNECOLOGIC HISTORY: Patient's last menstrual period was 04/04/2011. Contraception:postmenopause  Menopausal hormone therapy: none         OB History    Gravida Para Term Preterm AB Living   2 2 2     2    SAB TAB Ectopic Multiple Live Births                     Patient Active Problem List   Diagnosis Date Noted  . Unspecified essential hypertension 05/15/2013  . Benign microscopic hematuria 05/15/2013    Past Medical History:  Diagnosis Date  . Arthritis   . Dry eyes   . Hypertension   . Microscopic hematuria 2012   w/u -    Past Surgical History:  Procedure Laterality Date  . COLONOSCOPY    . DILATATION & CURETTAGE/HYSTEROSCOPY WITH MYOSURE N/A 08/16/2015   Procedure: DILATATION & CURETTAGE/HYSTEROSCOPY WITH MYOSURE;  Surgeon: Romualdo BolkJill Evelyn Yuepheng Schaller, MD;  Location: [facility];  Service: Gynecology;  Laterality: N/A;  . WISDOM TOOTH EXTRACTION      Current Outpatient Prescriptions  Medication Sig Dispense Refill  . cycloSPORINE (RESTASIS) 0.05 % ophthalmic emulsion Place 1 drop into both eyes  2 (two) times daily.    Marland Kitchen. estrogen, conjugated,-medroxyprogesterone (PREMPRO) 0.3-1.5 MG tablet Take 1 tablet by mouth daily. 90 tablet 3  . losartan (COZAAR) 50 MG tablet Take 50 mg by mouth daily.    . Misc Natural Products (GLUCOSAMINE CHONDROITIN VIT D3 PO) Take 3 tablets by mouth daily.    Marland Kitchen. OVER THE COUNTER MEDICATION Take 2 capsules by mouth daily. Ultra Flora Balance Probiotic    . Estradiol 10 MCG TABS vaginal tablet Place 1 tablet vaginal qhs x 1 week, then 2 x a week at hs (Patient not taking: Reported on 07/24/2016) 24 tablet 4   No current facility-administered medications for this visit.      ALLERGIES: Patient has no known allergies.  Family History  Problem Relation Age of Onset  . Bladder Cancer Mother 4082  . Lung cancer Father     smoker  . Ovarian cancer Cousin     paternal #1  . Colon cancer Cousin     paternal #2  . Breast cancer Cousin     maternal  . Cancer Maternal Grandmother     liver  . Lung cancer Maternal Grandfather     smoker    Social History   Social History  . Marital status: Married    Spouse name: N/A  . Number of children:  N/A  . Years of education: N/A   Occupational History  . Not on file.   Social History Main Topics  . Smoking status: Never Smoker  . Smokeless tobacco: Never Used  . Alcohol use 0.0 oz/week     Comment: Occassionally  . Drug use: No  . Sexual activity: Yes    Partners: Male    Birth control/ protection: Post-menopausal   Other Topics Concern  . Not on file   Social History Narrative  . No narrative on file    Review of Systems  Constitutional: Negative.   HENT: Negative.   Gastrointestinal: Negative.   Genitourinary: Positive for urgency.  Musculoskeletal: Negative.   Skin: Negative.   Neurological: Negative.   Endo/Heme/Allergies: Negative.   Psychiatric/Behavioral: Negative.     PHYSICAL EXAMINATION:    BP 130/70 (BP Location: Right Arm, Patient Position: Sitting, Cuff Size: Normal)    Pulse 80   Resp 13   Wt 155 lb (70.3 kg)   LMP 04/04/2011   BMI 26.61 kg/m     General appearance: alert, cooperative and appears stated age  Pelvic: External genitalia:  no lesions              Urethra:  normal appearing urethra with no masses, tenderness or lesions              Bartholins and Skenes: normal                 Vagina: normal appearing atrophic vagina with normal color and a slight white vaginal discharge, no lesions              Cervix: no lesions              Bimanual Exam:  Uterus:  normal size, contour, position, consistency, mobility, non-tender              Adnexa: no mass, fullness, tenderness               Chaperone was present for exam.  ASSESSMENT Urinary urgency, treated for a UTI 3 weeks ago Hematuria, unchanged (has been evaluated) Dyspareunia  Vaginal atrophy, only tried the vagifem x 1 then stopped with UTI symptoms Anxiety/depression, low level    PLAN Send urine for ua, c&s Send wet prep probe She will restart the vagifem Start Celexa, reviewed side effects F/U in 1 month   An After Visit Summary was printed and given to the patient.

## 2016-07-25 ENCOUNTER — Encounter: Payer: Self-pay | Admitting: Obstetrics and Gynecology

## 2016-07-25 ENCOUNTER — Other Ambulatory Visit: Payer: Self-pay | Admitting: Obstetrics and Gynecology

## 2016-07-25 DIAGNOSIS — R82998 Other abnormal findings in urine: Secondary | ICD-10-CM

## 2016-07-25 LAB — URINALYSIS, MICROSCOPIC ONLY
Bacteria, UA: NONE SEEN [HPF]
CRYSTALS: NONE SEEN [HPF]
WBC UA: NONE SEEN WBC/HPF (ref <=5)
Yeast: NONE SEEN [HPF]

## 2016-07-25 LAB — WET PREP BY MOLECULAR PROBE
Candida species: NEGATIVE
GARDNERELLA VAGINALIS: NEGATIVE
TRICHOMONAS VAG: NEGATIVE

## 2016-07-26 LAB — URINE CULTURE: Organism ID, Bacteria: NO GROWTH

## 2016-07-30 ENCOUNTER — Telehealth: Payer: Self-pay

## 2016-07-30 NOTE — Telephone Encounter (Signed)
Visit Follow-Up Question  Message [phone]  From Polo RileyRobin R Hartstein To Romualdo [name], MD Sent 07/25/2016 8:12 PM  Dear Dr. Oscar LaJertson, I have a follow up appt. scheduled for 12/20. Can the labs to recheck urine due to casts be done at that time or do I need to come before that?   Thank you,  Kathryn Odom   Responsible Party   Pool - [facility] Pool No one has taken responsibility for this message.  No actions have been taken on this message.   Dr.Jertson, patient was seen on 07/24/2016. Minimal amount of casts were found in her urine. Due to come in for a 4 week follow up on 08/22/2016. Okay to wait and recheck urine at that appointment?

## 2016-07-30 NOTE — Telephone Encounter (Signed)
Telephone message created to review with Dr.Jertson.

## 2016-08-01 NOTE — Telephone Encounter (Signed)
Spoke with patient. Advised of message as seen below from Dr.Jertson. Patient is agreeable and verbalizes understanding. Note placed on patient's appointment that she will need urinalysis.  Routing to provider for final review. Patient agreeable to disposition. Will close encounter.

## 2016-08-01 NOTE — Telephone Encounter (Signed)
I think it's fine to recheck her urine at that visit. Please just make sure it is scheduled appropriately. Tell her to come well hydrated.

## 2016-08-18 ENCOUNTER — Telehealth: Payer: Self-pay | Admitting: Obstetrics & Gynecology

## 2016-08-18 MED ORDER — SULFAMETHOXAZOLE-TRIMETHOPRIM 800-160 MG PO TABS: 1.0000 | ORAL_TABLET | Freq: Two times a day (BID) | ORAL | 0 refills | Status: DC

## 2016-08-18 NOTE — Telephone Encounter (Signed)
Pt thinks she has a UTI.  Was recently treated for UTI with an urgent care due to traveling for work.  Was initially treated with macrobid and then switched to ciprofloxin.  She ended up taking this for about 7-10 days.  Saw Dr. Oscar LaJertson in follow up and urine testing was negative.  Pt has no allergies.  She did start Azo last night to help.   [facility] DS [facility] [place] [place], per pt request.  #[id number].  [place] is in [place], [place], for daughter's graduation.  Very appreciative of phone call.  She has appt with Dr. Oscar LaJertson next week.  She will definitely keep this appt.  Routing to Dr. Oscar LaJertson for review.

## 2016-08-22 ENCOUNTER — Ambulatory Visit: Payer: BLUE CROSS/BLUE SHIELD | Admitting: Obstetrics and Gynecology

## 2016-08-22 ENCOUNTER — Encounter: Payer: Self-pay | Admitting: Obstetrics and Gynecology

## 2016-08-22 VITALS — BP 116/80 | HR 96 | Resp 16 | Ht 63.0 in | Wt 153.0 lb

## 2016-08-22 DIAGNOSIS — R8299 Other abnormal findings in urine: Secondary | ICD-10-CM | POA: Diagnosis not present

## 2016-08-22 DIAGNOSIS — R300 Dysuria: Secondary | ICD-10-CM | POA: Diagnosis not present

## 2016-08-22 DIAGNOSIS — F419 Anxiety disorder, unspecified: Secondary | ICD-10-CM

## 2016-08-22 DIAGNOSIS — F418 Other specified anxiety disorders: Secondary | ICD-10-CM

## 2016-08-22 DIAGNOSIS — F329 Major depressive disorder, single episode, unspecified: Secondary | ICD-10-CM

## 2016-08-22 DIAGNOSIS — N941 Unspecified dyspareunia: Secondary | ICD-10-CM | POA: Diagnosis not present

## 2016-08-22 DIAGNOSIS — R82998 Other abnormal findings in urine: Secondary | ICD-10-CM

## 2016-08-22 LAB — POCT URINALYSIS DIPSTICK
Bilirubin, UA: NEGATIVE
Glucose, UA: NEGATIVE
KETONES UA: NEGATIVE
Leukocytes, UA: NEGATIVE
Nitrite, UA: NEGATIVE
PROTEIN UA: NEGATIVE
RBC UA: 1
UROBILINOGEN UA: NEGATIVE
pH, UA: 5

## 2016-08-22 MED ORDER — CITALOPRAM HYDROBROMIDE 20 MG PO TABS: ORAL_TABLET | ORAL | 1 refills | Status: DC

## 2016-08-22 NOTE — Progress Notes (Signed)
GYNECOLOGY  VISIT   HPI: 55 y.o.   Married  Caucasian  female   G2P2002 with Patient's last menstrual period was 04/04/2011.   here for follow up urinary urgency, dyspareunia and anxiety/depression. At her last visit she was advised to retry vagifem and was started on Celexa. Daughter graduated this weekend. She started having UTI symptoms last weekend, Dr Hyacinth Meeker called in an antibiotic. She is still having a vague discomfort at the opening of her vagina. Initially she was having urinary frequency, urgency and pain. She had intercourse on Thursday and got these symptoms on Friday. She is having more lubrication with intercourse, she still has mild discomfort with intercourse, feels like an abrasion. It is improving.  She was treated for a UTI at urgent care in 11/17, + culture. F/U here later in November with negative culture.  She has noticed some trembling over the last couple of weeks, wondering if it's from the Celexa. She feels less sad, not depressed. Anxiety is still present, but a little better. Very stressful time at work. She feels more even emotionally. Generally sleeps well, her mind races if she wakes up in the morning. She currently has a URI and hasn't been feeling well.   GYNECOLOGIC HISTORY: Patient's last menstrual period was 04/04/2011. Contraception: post menopausal  Menopausal hormone therapy: estradiol vag tab, Prempro 0.3-1.5 tab        OB History    Gravida Para Term Preterm AB Living   2 2 2     2    SAB TAB Ectopic Multiple Live Births                     Patient Active Problem List   Diagnosis Date Noted  . Unspecified essential hypertension 05/15/2013  . Benign microscopic hematuria 05/15/2013    Past Medical History:  Diagnosis Date  . Arthritis   . Dry eyes   . Hypertension   . Microscopic hematuria 2012   w/u -    Past Surgical History:  Procedure Laterality Date  . COLONOSCOPY    . DILATATION & CURETTAGE/HYSTEROSCOPY WITH MYOSURE N/A  08/16/2015   Procedure: DILATATION & CURETTAGE/HYSTEROSCOPY WITH MYOSURE;  Surgeon: Romualdo Bolk, MD;  Location: [facility];  Service: Gynecology;  Laterality: N/A;  . WISDOM TOOTH EXTRACTION      Current Outpatient Prescriptions  Medication Sig Dispense Refill  . citalopram (CELEXA) 20 MG tablet Take 1/2 a tablet daily x 1 week, then increase to one tablet po qd 30 tablet 1  . cycloSPORINE (RESTASIS) 0.05 % ophthalmic emulsion Place 1 drop into both eyes 2 (two) times daily.    . Estradiol 10 MCG TABS vaginal tablet Place 1 tablet vaginal qhs x 1 week, then 2 x a week at hs 24 tablet 4  . estrogen, conjugated,-medroxyprogesterone (PREMPRO) 0.3-1.5 MG tablet Take 1 tablet by mouth daily. 90 tablet 3  . losartan (COZAAR) 50 MG tablet Take 50 mg by mouth daily.    . Misc Natural Products (GLUCOSAMINE CHONDROITIN VIT D3 PO) Take 3 tablets by mouth daily.    Marland Kitchen OVER THE COUNTER MEDICATION Take 2 capsules by mouth daily. Ultra Flora Balance Probiotic    . sulfamethoxazole-trimethoprim (BACTRIM DS) 800-160 MG tablet Take 1 tablet by mouth 2 (two) times daily. 10 tablet 0   No current facility-administered medications for this visit.      ALLERGIES: Patient has no known allergies.  Family History  Problem Relation Age of Onset  . Bladder Cancer  Mother 6182  . Lung cancer Father     smoker  . Ovarian cancer Cousin     paternal #1  . Colon cancer Cousin     paternal #2  . Breast cancer Cousin     maternal  . Cancer Maternal Grandmother     liver  . Lung cancer Maternal Grandfather     smoker    Social History   Social History  . Marital status: Married    Spouse name: N/A  . Number of children: N/A  . Years of education: N/A   Occupational History  . Not on file.   Social History Main Topics  . Smoking status: Never Smoker  . Smokeless tobacco: Never Used  . Alcohol use 0.0 oz/week     Comment: Occassionally  . Drug use: No  . Sexual activity: Yes    Partners: Male     Birth control/ protection: Post-menopausal   Other Topics Concern  . Not on file   Social History Narrative  . No narrative on file    Review of Systems  Constitutional: Negative.   HENT: Positive for sinus pain.   Eyes: Negative.   Respiratory: Negative.   Cardiovascular: Negative.   Gastrointestinal: Negative.   Genitourinary:       Loss of urine with sneeze   Musculoskeletal: Negative.   Skin: Negative.   Neurological: Positive for headaches.  Endo/Heme/Allergies: Negative.   Psychiatric/Behavioral: Negative.     PHYSICAL EXAMINATION:    BP 116/80 (BP Location: Right Arm, Patient Position: Sitting, Cuff Size: Normal)   Pulse 96   Resp 16   Ht 5\' 3"  (1.6 m)   Wt 153 lb (69.4 kg)   LMP 04/04/2011   BMI 27.10 kg/m     General appearance: alert, cooperative and appears stated age Abdomen: soft, non-tender; bowel sounds normal; no masses,  no organomegaly CVA: not tender  Pelvic: External genitalia:  no lesions              Urethra:  normal appearing urethra with no masses, tenderness or lesions              Bartholins and Skenes: normal                 Vagina: normal appearing vagina with mild atrophy, normal color and discharge, no lesions. Able to insert 2 fingers vaginally without pain              Cervix: no lesions              Bimanual Exam:  Uterus:  normal size, contour, position, consistency, mobility, non-tender              Adnexa: no mass, fullness, tenderness               Chaperone was present for exam.  ASSESSMENT Casts in her urine last time Recently treated for a UTI over the phone, still with mild symptoms Vaginal atrophy and dyspareunia, improving, pain not gone Anxiety/depression Hand tremor, off and on. Celexa can cause tremor    PLAN Send urine for ua, c&s Continue vagifem, try different lubricants, she will control rate and depth of penetration She will give the Celexa a few more weeks. Could consider going up a little bit (ie 30 mg a  day) and see if helps, or makes her tremor worse If she keeps getting UTI's after sex will put on suppression.    An After Visit Summary was printed and  given to the patient.  25 minutes face to face time of which over 50% was spent in counseling.

## 2016-08-22 NOTE — Addendum Note (Signed)
Addended by: Tobi BastosJERTSON, JILL E on: 08/22/2016 06:44 PM   Modules accepted: Orders

## 2016-08-23 LAB — URINALYSIS, MICROSCOPIC ONLY
Bacteria, UA: NONE SEEN [HPF]
CASTS: NONE SEEN [LPF]
Crystals: NONE SEEN [HPF]
YEAST: NONE SEEN [HPF]

## 2016-08-24 ENCOUNTER — Telehealth: Payer: Self-pay | Admitting: Obstetrics and Gynecology

## 2016-08-24 MED ORDER — CEPHALEXIN 500 MG PO CAPS: 500.0000 mg | ORAL_CAPSULE | Freq: Two times a day (BID) | ORAL | 0 refills | Status: DC

## 2016-08-24 NOTE — Telephone Encounter (Signed)
Has finished abx treatment with Bactrim. Last dosage was 2 days ago and took a total of 5 days of tx from Dr. Hyacinth MeekerMiller. Feels a little twinge. No pain with urination.  Pain is vague, which is her her usual symptoms.  UC showing Staphylococcus 10 - 50,00 colonies.   She wants to have an Rx and will take if her symptoms are really increasing.  Rx Keflex 500 mg po bid for 5 days sent to local pharmacy.

## 2016-08-25 LAB — URINE CULTURE

## 2016-09-17 ENCOUNTER — Encounter: Payer: Self-pay | Admitting: Obstetrics and Gynecology

## 2016-09-17 ENCOUNTER — Telehealth: Payer: Self-pay | Admitting: *Deleted

## 2016-09-17 MED ORDER — CITALOPRAM HYDROBROMIDE 20 MG PO TABS: ORAL_TABLET | ORAL | 2 refills | Status: DC

## 2016-09-17 MED ORDER — ESTRADIOL 10 MCG VA TABS: ORAL_TABLET | VAGINAL | 2 refills | Status: DC

## 2016-09-17 NOTE — Telephone Encounter (Signed)
Please let the patient know that her scripts have been sent and cancel her f/u visit on 1/22.

## 2016-09-17 NOTE — Telephone Encounter (Signed)
Dr. Oscar LaJertson, see MyChart message below and advise? From [name] To Romualdo [name], MD Sent 09/17/2016 12:05 PM  Hi Dr. Oscar LaJertson - I am scheduled for a follow up with you on 1/22, which I am feeling is not necessary, but want to get your perspective. When I was in the office in Dec. I was experiencing some trembling which we attributed to the anxiety medication - Citalopram. Since then, this has greatly improved. I am comfortable continuing with this medication and the vaginal estrogen (Estradiol). Are you comfortable giving me prescriptions to continue these without another follow up visit?   If so I will cancel the appointment. If not, I will reschedule as I now have a work conflict with this time. Thank you for your consideration and response. Hope you are doing well. Kathryn Odom- Kathryn Odom DOB [birth year].

## 2016-09-17 NOTE — Telephone Encounter (Signed)
Telephone encounter created and routed to provider for review.

## 2016-09-18 MED ORDER — ESTRADIOL 10 MCG VA TABS: ORAL_TABLET | VAGINAL | 2 refills | Status: DC

## 2016-09-18 MED ORDER — CITALOPRAM HYDROBROMIDE 20 MG PO TABS: ORAL_TABLET | ORAL | 2 refills | Status: DC

## 2016-09-18 NOTE — Telephone Encounter (Signed)
Spoke with patient. Advised as seen below per Dr. Oscar LaJertson. Appointment for 09/24/16 cancelled, patient is aware. Patient asked to verify pharmacy that medications celexa and estradiol went to? [facility] [place] -patient states she only used that pharmacy once while out of town, will need prescriptions changed to [facility]. Advised patient pharmacy preferences updated.  Advised patient new order placed for Estradiol and Celexa. Patient verbalizes understanding and is agreeable.  [facility] [place] [phone] to cancel Celexa and Estradiol, spoke with Selena BattenKim.  Routing to provider for final review. Patient is agreeable to disposition. Will close encounter.

## 2016-09-18 NOTE — Telephone Encounter (Signed)
Left message to call Britiney Blahnik at [phone].  

## 2016-09-24 ENCOUNTER — Ambulatory Visit: Payer: BLUE CROSS/BLUE SHIELD | Admitting: Obstetrics and Gynecology

## 2016-09-25 ENCOUNTER — Other Ambulatory Visit: Payer: Self-pay | Admitting: Obstetrics and Gynecology

## 2016-09-25 DIAGNOSIS — Z1231 Encounter for screening mammogram for malignant neoplasm of breast: Secondary | ICD-10-CM

## 2016-10-12 ENCOUNTER — Ambulatory Visit
Admission: RE | Admit: 2016-10-12 | Discharge: 2016-10-12 | Disposition: A | Discharge status: Home / Self Care | Payer: BLUE CROSS/BLUE SHIELD | Source: Ambulatory Visit | Attending: Obstetrics and Gynecology | Admitting: Obstetrics and Gynecology

## 2016-10-12 DIAGNOSIS — Z1231 Encounter for screening mammogram for malignant neoplasm of breast: Secondary | ICD-10-CM

## 2016-10-16 DIAGNOSIS — M160 Bilateral primary osteoarthritis of hip: Secondary | ICD-10-CM | POA: Insufficient documentation

## 2017-03-15 ENCOUNTER — Encounter: Payer: Self-pay | Admitting: Obstetrics and Gynecology

## 2017-03-15 ENCOUNTER — Ambulatory Visit (INDEPENDENT_AMBULATORY_CARE_PROVIDER_SITE_OTHER): Payer: BLUE CROSS/BLUE SHIELD | Admitting: Obstetrics and Gynecology

## 2017-03-15 VITALS — BP 140/80 | HR 84 | Temp 98.9°F | Resp 16 | Ht 63.0 in | Wt 165.0 lb

## 2017-03-15 DIAGNOSIS — R300 Dysuria: Secondary | ICD-10-CM | POA: Diagnosis not present

## 2017-03-15 DIAGNOSIS — N3090 Cystitis, unspecified without hematuria: Secondary | ICD-10-CM

## 2017-03-15 MED ORDER — PHENAZOPYRIDINE HCL 200 MG PO TABS: 200.0000 mg | ORAL_TABLET | Freq: Three times a day (TID) | ORAL | 0 refills | Status: DC | PRN

## 2017-03-15 MED ORDER — SULFAMETHOXAZOLE-TRIMETHOPRIM 800-160 MG PO TABS: 1.0000 | ORAL_TABLET | Freq: Two times a day (BID) | ORAL | 0 refills | Status: DC

## 2017-03-15 NOTE — Patient Instructions (Signed)

## 2017-03-15 NOTE — Progress Notes (Signed)
GYNECOLOGY  VISIT   HPI: 56 y.o.   Married  Caucasian  female   G2P2002 with Patient's last menstrual period was 05/24/2011.   here for UTI symptoms. This morning she started having suprapubic discomfort, urinary frequency and urgency. Dysuria.  She started Azo this morning, which has helped. No recent intercourse. She is using the vagifem, has increased stress at work. 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 She has been there for 31 years.  No fevers or flank pain.   GYNECOLOGIC HISTORY: Patient's last menstrual period was 05/24/2011. Contraception: post menopausal  Menopausal hormone therapy: prempro, estradiol 10 mcg vag tab        OB History    Gravida Para Term Preterm AB Living   2 2 2     2    SAB TAB Ectopic Multiple Live Births                     Patient Active Problem List   Diagnosis Date Noted  . Unspecified essential hypertension 05/15/2013  . Benign microscopic hematuria 05/15/2013    Past Medical History:  Diagnosis Date  . Arthritis   . Dry eyes   . Hypertension   . Microscopic hematuria 2012   w/u -    Past Surgical History:  Procedure Laterality Date  . COLONOSCOPY    . DILATATION & CURETTAGE/HYSTEROSCOPY WITH MYOSURE N/A 08/16/2015   Procedure: DILATATION & CURETTAGE/HYSTEROSCOPY WITH MYOSURE;  Surgeon: Romualdo Bolk, MD;  Location: [facility];  Service: Gynecology;  Laterality: N/A;  . WISDOM TOOTH EXTRACTION      Current Outpatient Prescriptions  Medication Sig Dispense Refill  . citalopram (CELEXA) 20 MG tablet One tablet po qd 90 tablet 2  . cycloSPORINE (RESTASIS) 0.05 % ophthalmic emulsion Place 1 drop into both eyes 2 (two) times daily.    . Estradiol 10 MCG TABS vaginal tablet Place 1 tablet vaginal 2 x a week at hs 24 tablet 2  . estrogen, conjugated,-medroxyprogesterone (PREMPRO) 0.3-1.5 MG tablet Take 1 tablet by mouth daily. 90 tablet 3  .  Glucosamine-Chondroitin (COSAMIN DS) 500-400 MG CAPS TAKE 3 CAPSULES EVERY DAY    . losartan (COZAAR) 50 MG tablet Take 50 mg by mouth daily.    Marland Kitchen OVER THE COUNTER MEDICATION Take 2 capsules by mouth daily. Ultra Flora Balance Probiotic     No current facility-administered medications for this visit.      ALLERGIES: Patient has no known allergies.  Family History  Problem Relation Age of Onset  . Bladder Cancer Mother 34  . Lung cancer Father        smoker  . Ovarian cancer Cousin        paternal #1  . Colon cancer Cousin        paternal #2  . Breast cancer Cousin        maternal  . Cancer Maternal Grandmother        liver  . Lung cancer Maternal Grandfather        smoker    Social History   Social History  . Marital status: Married    Spouse name: N/A  . Number of children: N/A  . Years of education: N/A   Occupational History  . Not on file.   Social History Main Topics  . Smoking status: Never Smoker  . Smokeless tobacco: Never Used  . Alcohol use 0.0  oz/week     Comment: Occassionally  . Drug use: No  . Sexual activity: Yes    Partners: Male    Birth control/ protection: Post-menopausal   Other Topics Concern  . Not on file   Social History Narrative  . No narrative on file    Review of Systems  Constitutional: Negative.   HENT: Negative.   Eyes: Negative.   Respiratory: Negative.   Cardiovascular: Negative.   Gastrointestinal: Negative.   Genitourinary: Positive for dysuria and frequency.       Loss of urine with cough or sneeze   Musculoskeletal: Negative.   Skin: Negative.   Neurological: Negative.   Endo/Heme/Allergies: Negative.   Psychiatric/Behavioral: Negative.     PHYSICAL EXAMINATION:    BP 140/80 (BP Location: Right Arm, Patient Position: Sitting, Cuff Size: Normal)   Pulse 84   Temp 98.9 F (37.2 C) (Oral)   Resp 16   Ht 5\' 3"  (1.6 m)   Wt 165 lb (74.8 kg)   LMP 05/24/2011   BMI 29.23 kg/m     General appearance: alert,  cooperative and appears stated age Abdomen: soft, non-distended; bowel sounds normal; no masses,  no organomegaly CVA: not tender  ASSESSMENT Cystitis by history (urine with AZO, not able to dip)     PLAN Bactrim Pyridium Send urine for ua, c&s   An After Visit Summary was printed and given to the patient.

## 2017-03-16 LAB — URINALYSIS, MICROSCOPIC ONLY: CASTS: NONE SEEN /LPF

## 2017-03-19 LAB — URINE CULTURE

## 2017-03-20 ENCOUNTER — Other Ambulatory Visit: Payer: Self-pay | Admitting: *Deleted

## 2017-03-20 MED ORDER — NITROFURANTOIN MONOHYD MACRO 100 MG PO CAPS: 100.0000 mg | ORAL_CAPSULE | Freq: Two times a day (BID) | ORAL | 0 refills | Status: DC

## 2017-06-10 ENCOUNTER — Other Ambulatory Visit: Payer: Self-pay | Admitting: Obstetrics and Gynecology

## 2017-06-10 NOTE — Telephone Encounter (Signed)
Medication refill request: Celexa and Prempro  Last AEX:  06-20-16  Next AEX: 06-27-17  Last MMG (if hormonal medication request): 10-12-16 WNL  Refill authorized: please advise

## 2017-06-27 ENCOUNTER — Encounter: Payer: Self-pay | Admitting: Obstetrics and Gynecology

## 2017-06-27 ENCOUNTER — Other Ambulatory Visit (HOSPITAL_COMMUNITY)
Admission: RE | Admit: 2017-06-27 | Discharge: 2017-06-27 | Disposition: A | Discharge status: Home / Self Care | Payer: BLUE CROSS/BLUE SHIELD | Source: Ambulatory Visit | Attending: Obstetrics and Gynecology | Admitting: Obstetrics and Gynecology

## 2017-06-27 ENCOUNTER — Ambulatory Visit: Payer: BLUE CROSS/BLUE SHIELD | Admitting: Obstetrics and Gynecology

## 2017-06-27 VITALS — BP 138/74 | HR 84 | Resp 14 | Ht 64.0 in | Wt 167.0 lb

## 2017-06-27 DIAGNOSIS — N950 Postmenopausal bleeding: Secondary | ICD-10-CM | POA: Insufficient documentation

## 2017-06-27 DIAGNOSIS — N6313 Unspecified lump in the right breast, lower outer quadrant: Secondary | ICD-10-CM

## 2017-06-27 DIAGNOSIS — Z124 Encounter for screening for malignant neoplasm of cervix: Secondary | ICD-10-CM | POA: Insufficient documentation

## 2017-06-27 DIAGNOSIS — Z8659 Personal history of other mental and behavioral disorders: Secondary | ICD-10-CM | POA: Diagnosis not present

## 2017-06-27 DIAGNOSIS — Z79890 Hormone replacement therapy (postmenopausal): Secondary | ICD-10-CM

## 2017-06-27 DIAGNOSIS — Z01419 Encounter for gynecological examination (general) (routine) without abnormal findings: Secondary | ICD-10-CM

## 2017-06-27 MED ORDER — CITALOPRAM HYDROBROMIDE 20 MG PO TABS: 20.0000 mg | ORAL_TABLET | Freq: Every day | ORAL | 3 refills | Status: DC

## 2017-06-27 NOTE — Patient Instructions (Signed)

## 2017-06-27 NOTE — Progress Notes (Signed)
56 y.o. [id number] MarriedCaucasianF here for annual exam. Patient c/o spotting last week. The spotting just lasted for a day. No intercourse prior to the bleeding. No pain. She is on low dose ERT and vaginal estrogen.  She still gets hot at night, sleeps well overall. No vasomotor symptoms.  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  2 years ago the patient had a hysteroscopic polypectomy with benign pathology.  She started weight watchers on Monday. She had an elevated screening cholesterol at work. She will f/u with her primary and have more blood work in December.  She is on Celexa for low level depression, working well and she is feeling fine on it.  Stress at work, lots of change with layoffs. She has a new job.     Patient's last menstrual period was 05/24/2011.          Sexually active: Yes.    The current method of family planning is post menopausal status.    Exercising: No.  The patient does not participate in regular exercise at present. Smoker:  no  Health Maintenance: Pap:  06-20-16 WNL NEG HR HPV  History of abnormal Pap: yes- repeat PAP WNL  MMG:  10-12-16 Colonoscopy:  2010 WNL BMD:   Years ago NNL TDaP:  03-02-09 Gardasil: N/A   reports that she has never smoked. She has never used smokeless tobacco. She reports that she drinks alcohol. She reports that she does not use drugs.  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  2 daughters, both out of college  Past Medical History:  Diagnosis Date  . Arthritis   . Dry eyes   . Hypertension   . Microscopic hematuria 2012   w/u -    Past Surgical History:  Procedure Laterality Date  . COLONOSCOPY    . DILATATION & CURETTAGE/HYSTEROSCOPY WITH MYOSURE N/A 08/16/2015   Procedure: DILATATION & CURETTAGE/HYSTEROSCOPY WITH MYOSURE;  Surgeon: Romualdo Bolk, MD;  Location: [facility];  Service: Gynecology;  Laterality: N/A;  . WISDOM TOOTH EXTRACTION       Current Outpatient Prescriptions  Medication Sig Dispense Refill  . citalopram (CELEXA) 20 MG tablet TAKE ONE TABLET BY MOUTH EVERY DAY 30 tablet 0  . cycloSPORINE (RESTASIS) 0.05 % ophthalmic emulsion Place 1 drop into both eyes 2 (two) times daily.    . Estradiol 10 MCG TABS vaginal tablet Place 1 tablet vaginal 2 x a week at hs 24 tablet 2  . Glucosamine-Chondroitin (COSAMIN DS) 500-400 MG CAPS TAKE 3 CAPSULES EVERY DAY    . losartan (COZAAR) 50 MG tablet Take 50 mg by mouth daily.    Marland Kitchen OVER THE COUNTER MEDICATION Take 2 capsules by mouth daily. Ultra Flora Balance Probiotic    . PREMPRO 0.3-1.5 MG tablet TAKE ONE TABLET BY MOUTH EVERY DAY 28 tablet 0   No current facility-administered medications for this visit.     Family History  Problem Relation Age of Onset  . Bladder Cancer Mother 64  . Lung cancer Father        smoker  . Ovarian cancer Cousin        paternal #1  . Colon cancer Cousin        paternal #2  . Breast cancer Cousin        maternal  . Cancer Maternal Grandmother        liver  . Lung cancer Maternal Grandfather        smoker  Review of Systems  Constitutional: Negative.   HENT: Negative.   Eyes: Negative.   Respiratory: Negative.   Cardiovascular: Negative.   Gastrointestinal: Negative.   Endocrine: Negative.   Genitourinary: Negative.        Spotting   Musculoskeletal: Negative.   Skin: Negative.   Allergic/Immunologic: Negative.   Neurological: Negative.   Psychiatric/Behavioral: Negative.     Exam:   BP 138/74 (BP Location: Right Arm, Patient Position: Sitting, Cuff Size: Normal)   Pulse 84   Resp 14   Ht 5\' 4"  (1.626 m)   Wt 167 lb (75.8 kg)   LMP 05/24/2011   BMI 28.67 kg/m   Weight change: @WEIGHTCHANGE @ Height:   Height: 5\' 4"  (162.6 cm)  Ht Readings from Last 3 Encounters:  06/27/17 5\' 4"  (1.626 m)  03/15/17 5\' 3"  (1.6 m)  08/22/16 5\' 3"  (1.6 m)    General appearance: alert, cooperative and appears stated age Head:  Normocephalic, without obvious abnormality, atraumatic Neck: no adenopathy, supple, symmetrical, trachea midline and thyroid normal to inspection and palpation Lungs: clear to auscultation bilaterally Cardiovascular: regular rate and rhythm Breasts: in the right breast at 6-7 o'clock right outside the areolar region is a 1.5 cm bean shaped, smooth, mobile lump. No other lumps, no skin changes.  Abdomen: soft, non-tender; non distended,  no masses,  no organomegaly Extremities: extremities normal, atraumatic, no cyanosis or edema Skin: Skin color, texture, turgor normal. No rashes or lesions Lymph nodes: Cervical, supraclavicular, and axillary nodes normal. No abnormal inguinal nodes palpated Neurologic: Grossly normal   Pelvic: External genitalia:  no lesions              Urethra:  normal appearing urethra with no masses, tenderness or lesions              Bartholins and Skenes: normal                 Vagina: normal appearing vagina with normal color and discharge, no lesions              Cervix: no lesions and friable with pap               Bimanual Exam:  Uterus:  normal size, contour, position, consistency, mobility, non-tender              Adnexa: no mass, fullness, tenderness               Rectovaginal: Confirms               Anus:  normal sphincter tone, no lesions  Chaperone was present for exam.  A:  Well Woman with normal exam  PMP bleeding  Depression controlled on HRT  HRT, willing to try going off.  Right breast lump  P:   Pap with reflex hpv  Return for an u/s, possible sonohysterogram, possible biopsy  Labs with primary  Will stop HRT  Try going off of the vaginal estrogen  Diagnostic breast imaging  F/U breast check in 6 weeks  Colonoscopy UTD  Discussed breast self exam  Discussed calcium and vit D intake

## 2017-06-27 NOTE — Progress Notes (Signed)
Patient scheduled while in office for right breast diagnostic MMG and US. Spoke with Kathryn Odom at [facility], patient scheduled for 07/03/17 arriving at 10:40am for 11am appointment. Patient scheduled for 6 wk breast recheck with Dr. Oscar LaJertson on 08/08/17 at 8:15am. Patient verbalizes understanding and is agreeable.

## 2017-06-28 LAB — CYTOLOGY - PAP: Diagnosis: NEGATIVE

## 2017-07-03 ENCOUNTER — Ambulatory Visit
Admission: RE | Admit: 2017-07-03 | Discharge: 2017-07-03 | Disposition: A | Discharge status: Home / Self Care | Payer: BLUE CROSS/BLUE SHIELD | Source: Ambulatory Visit | Attending: Obstetrics and Gynecology | Admitting: Obstetrics and Gynecology

## 2017-07-03 DIAGNOSIS — N6313 Unspecified lump in the right breast, lower outer quadrant: Secondary | ICD-10-CM

## 2017-07-04 ENCOUNTER — Other Ambulatory Visit: Payer: Self-pay | Admitting: Obstetrics and Gynecology

## 2017-07-04 NOTE — Telephone Encounter (Signed)
Medication refill request: prempro Last AEX:  06/27/17 JJ Next AEX: 07/23/18 JJ Last MMG (if hormonal medication request): 07/03/17 US Right [email or web address]:neg. Screening due in 4 months Refill authorized: 06/20/16 #[id number]. Today please advise.

## 2017-07-05 ENCOUNTER — Other Ambulatory Visit: Payer: Self-pay | Admitting: Obstetrics and Gynecology

## 2017-07-05 NOTE — Telephone Encounter (Signed)
Medication refill request: prempro  Last AEX:  06/27/17 JJ Next AEX: 07/23/18 JJ Last MMG (if hormonal medication request): 07/03/17 US Right [email or web address]:neg  Refill authorized: 06/20/16 #[id number]. Today please advise.

## 2017-07-23 ENCOUNTER — Other Ambulatory Visit: Payer: Self-pay

## 2017-07-23 ENCOUNTER — Ambulatory Visit (INDEPENDENT_AMBULATORY_CARE_PROVIDER_SITE_OTHER): Payer: BLUE CROSS/BLUE SHIELD

## 2017-07-23 ENCOUNTER — Other Ambulatory Visit: Payer: Self-pay | Admitting: Obstetrics and Gynecology

## 2017-07-23 ENCOUNTER — Ambulatory Visit: Payer: BLUE CROSS/BLUE SHIELD | Admitting: Obstetrics and Gynecology

## 2017-07-23 ENCOUNTER — Encounter: Payer: Self-pay | Admitting: Obstetrics and Gynecology

## 2017-07-23 VITALS — BP 118/76 | HR 80 | Resp 14 | Wt 161.0 lb

## 2017-07-23 DIAGNOSIS — N950 Postmenopausal bleeding: Secondary | ICD-10-CM

## 2017-07-23 DIAGNOSIS — N6313 Unspecified lump in the right breast, lower outer quadrant: Secondary | ICD-10-CM | POA: Diagnosis not present

## 2017-07-23 NOTE — Progress Notes (Signed)
GYNECOLOGY  VISIT   HPI: 56 y.o.   Married  Caucasian  female   [id number] with Patient's last menstrual period was 05/24/2011.   here for follow up PMB. The patient was seen last moth for an annual exam, she c/o spotting on HRT. She stopped the ERT after her last visit. She is having tolerable vasomotor symptoms, some increase sleep disturbance. No more bleeding. She was noted to have a right breast lump  at her annual, negative imaging.   GYNECOLOGIC HISTORY: Patient's last menstrual period was 05/24/2011. Contraception:postmenopause Menopausal hormone therapy: none         OB History    Gravida Para Term Preterm AB Living   2 2 2     2    SAB TAB Ectopic Multiple Live Births                     Patient Active Problem List   Diagnosis Date Noted  . History of depression 06/27/2017  . Unspecified essential hypertension 05/15/2013  . Benign microscopic hematuria 05/15/2013    Past Medical History:  Diagnosis Date  . Arthritis   . Dry eyes   . Hypertension   . Microscopic hematuria 2012   w/u -    Past Surgical History:  Procedure Laterality Date  . COLONOSCOPY    . DILATATION & CURETTAGE/HYSTEROSCOPY WITH MYOSURE N/A 08/16/2015   Procedure: DILATATION & CURETTAGE/HYSTEROSCOPY WITH MYOSURE;  Surgeon: Romualdo BolkJill Evelyn Paulita Licklider, MD;  Location: [facility];  Service: Gynecology;  Laterality: N/A;  . WISDOM TOOTH EXTRACTION      Current Outpatient Medications  Medication Sig Dispense Refill  . citalopram (CELEXA) 20 MG tablet Take 1 tablet (20 mg total) by mouth daily. 90 tablet 3  . cycloSPORINE (RESTASIS) 0.05 % ophthalmic emulsion Place 1 drop into both eyes 2 (two) times daily.    . Glucosamine-Chondroitin (COSAMIN DS) 500-400 MG CAPS TAKE 3 CAPSULES EVERY DAY    . losartan (COZAAR) 50 MG tablet Take 50 mg by mouth daily.    Marland Kitchen. OVER THE COUNTER MEDICATION Take 2 capsules by mouth daily. Ultra Flora Balance Probiotic     No current facility-administered medications for this visit.       ALLERGIES: Patient has no known allergies.  Family History  Problem Relation Age of Onset  . Bladder Cancer Mother [id number]  . Lung cancer Father        smoker  . Ovarian cancer Cousin        paternal #1  . Colon cancer Cousin        paternal #2  . Breast cancer Cousin        maternal  . Cancer Maternal Grandmother        liver  . Lung cancer Maternal Grandfather        smoker    Social History   Socioeconomic History  . Marital status: Married    Spouse name: Not on file  . Number of children: Not on file  . Years of education: Not on file  . Highest education level: Not on file  Social Needs  . Financial resource strain: Not on file  . Food insecurity - worry: Not on file  . Food insecurity - inability: Not on file  . Transportation needs - medical: Not on file  . Transportation needs - non-medical: Not on file  Occupational History  . Not on file  Tobacco Use  . Smoking status: Never Smoker  . Smokeless tobacco: Never Used  Substance and Sexual Activity  . Alcohol use: Yes    Alcohol/week: 0.0 oz    Comment: Occassionally  . Drug use: No  . Sexual activity: Yes    Partners: Male    Birth control/protection: Post-menopausal  Other Topics Concern  . Not on file  Social History Narrative  . Not on file    Review of Systems  Constitutional: Negative.   HENT: Negative.   Eyes: Negative.   Respiratory: Negative.   Cardiovascular: Negative.   Gastrointestinal: Negative.   Genitourinary: Negative.   Musculoskeletal: Negative.   Skin: Negative.   Neurological: Negative.   Endo/Heme/Allergies: Negative.   Psychiatric/Behavioral: Negative.     PHYSICAL EXAMINATION:    BP 118/76 (BP Location: Right Arm, Patient Position: Sitting, Cuff Size: Normal)   Pulse 80   Resp 14   Wt 161 lb (73 kg)   LMP 05/24/2011   BMI 27.64 kg/m     General appearance: alert, cooperative and appears stated age Breasts: small mobile lump in the right breast at 6-7 o'clock,  just outside the areolar region, less distinct than at last exam.  Lymph: no axillary or supraclavicular adenopathy.  ASSESSMENT PMP bleeding on HRT, she had one day of spotting. She has since gone off of HRT, no further bleeding and thin uniform stripe on ultrasound Some worsening of sleep since stopping HRT Breast lump, slightly smaller, less prominent, negative imaging    PLAN If she has any further bleeding will do an endometrial biopsy Recommended she try estroven pm for her sleep issues since stopping HRT Continue monthly breast self exams Routine f/u mammogram due in 2/19   An After Visit Summary was printed and given to the patient.

## 2017-07-26 ENCOUNTER — Telehealth: Payer: Self-pay | Admitting: Gynecology

## 2017-07-26 NOTE — Telephone Encounter (Signed)
On-call note: Patient calls with 24-hour history of worsening dysuria, urgency and frequency.  No low back pain, fever or chills.  History of urinary tract infections before and familiar with the symptoms.  Ciprofloxacin 250 mg twice daily times 3 days called to her [facility] phone #[phone].  Will follow-up if her symptoms persist, worsen or recur.

## 2017-08-08 ENCOUNTER — Ambulatory Visit: Payer: Self-pay | Admitting: Obstetrics and Gynecology

## 2017-09-09 ENCOUNTER — Other Ambulatory Visit: Payer: Self-pay | Admitting: Obstetrics and Gynecology

## 2017-09-09 DIAGNOSIS — Z139 Encounter for screening, unspecified: Secondary | ICD-10-CM

## 2017-10-14 ENCOUNTER — Ambulatory Visit
Admission: RE | Admit: 2017-10-14 | Discharge: 2017-10-14 | Disposition: A | Discharge status: Home / Self Care | Payer: BLUE CROSS/BLUE SHIELD | Source: Ambulatory Visit | Attending: Obstetrics and Gynecology | Admitting: Obstetrics and Gynecology

## 2017-10-14 DIAGNOSIS — Z139 Encounter for screening, unspecified: Secondary | ICD-10-CM

## 2018-06-11 ENCOUNTER — Encounter: Payer: Self-pay | Admitting: Obstetrics and Gynecology

## 2018-06-11 ENCOUNTER — Other Ambulatory Visit: Payer: Self-pay

## 2018-06-11 ENCOUNTER — Ambulatory Visit: Payer: BLUE CROSS/BLUE SHIELD | Admitting: Obstetrics and Gynecology

## 2018-06-11 VITALS — BP 126/72 | HR 60 | Temp 98.6°F | Wt 161.0 lb

## 2018-06-11 DIAGNOSIS — N3090 Cystitis, unspecified without hematuria: Secondary | ICD-10-CM | POA: Diagnosis not present

## 2018-06-11 LAB — POCT URINALYSIS DIPSTICK
Bilirubin, UA: NEGATIVE
Glucose, UA: NEGATIVE
KETONES UA: NEGATIVE
NITRITE UA: NEGATIVE
PH UA: 5 (ref 5.0–8.0)
PROTEIN UA: POSITIVE — AB
RBC UA: POSITIVE
Spec Grav, UA: 1.01 (ref 1.010–1.025)
UROBILINOGEN UA: 0.2 U/dL

## 2018-06-11 MED ORDER — PHENAZOPYRIDINE HCL 200 MG PO TABS: 200.0000 mg | ORAL_TABLET | Freq: Three times a day (TID) | ORAL | 0 refills | Status: DC | PRN

## 2018-06-11 MED ORDER — AMOXICILLIN-POT CLAVULANATE 500-125 MG PO TABS: ORAL_TABLET | ORAL | 0 refills | Status: DC

## 2018-06-11 NOTE — Patient Instructions (Signed)

## 2018-06-11 NOTE — Progress Notes (Signed)
GYNECOLOGY  VISIT   HPI: 57 y.o.   Married White or Caucasian Not Hispanic or Latino  female   [phone] with Patient's last menstrual period was 05/24/2011.   here for UTI symptoms. Reports urinary frequency, urgency, and pain with urination that started yesterday. Was treated for a UTI at Urgent Care 2 weeks ago with 7 days of macrobid (sensitive to macrobid). Her symptoms resolved within a day or two of starting the antibiotic. Symptoms restarted yesterday. Urinary urgency, urgency and dysuria. No flank pain, no fever.  She is having bilateral hip replacements next Thursday.   GYNECOLOGIC HISTORY: Patient's last menstrual period was 05/24/2011. Contraception: Postmenopausal Menopausal hormone therapy: None        OB History    Gravida  2   Para  2   Term  2   Preterm      AB      Living  2     SAB      TAB      Ectopic      Multiple      Live Births  2              Patient Active Problem List   Diagnosis Date Noted  . History of depression 06/27/2017  . Primary osteoarthritis of hips, bilateral 10/16/2016  . Other and unspecified hyperlipidemia 05/27/2013  . Need for prophylactic hormone replacement therapy (postmenopausal) 05/27/2013  . Unspecified essential hypertension 05/15/2013  . Benign microscopic hematuria 05/15/2013    Past Medical History:  Diagnosis Date  . Arthritis   . Dry eyes   . Hypertension   . Microscopic hematuria 2012   w/u -    Past Surgical History:  Procedure Laterality Date  . COLONOSCOPY    . DILATATION & CURETTAGE/HYSTEROSCOPY WITH MYOSURE N/A 08/16/2015   Procedure: DILATATION & CURETTAGE/HYSTEROSCOPY WITH MYOSURE;  Surgeon: Romualdo Bolk, MD;  Location: [facility];  Service: Gynecology;  Laterality: N/A;  . WISDOM TOOTH EXTRACTION      Current Outpatient Medications  Medication Sig Dispense Refill  . citalopram (CELEXA) 20 MG tablet Take 1 tablet (20 mg total) by mouth daily. 90 tablet 3  . cycloSPORINE (RESTASIS)  0.05 % ophthalmic emulsion Place 1 drop into both eyes 2 (two) times daily.    Marland Kitchen docusate sodium (COLACE) 100 MG capsule Take 100 mg by mouth 2 (two) times daily.    . ferrous sulfate 325 (65 FE) MG tablet Take 325 mg by mouth daily with breakfast.    . Glucosamine-Chondroitin (COSAMIN DS) 500-400 MG CAPS TAKE 3 CAPSULES EVERY DAY    . losartan (COZAAR) 50 MG tablet Take 50 mg by mouth daily.    Marland Kitchen OVER THE COUNTER MEDICATION Take 2 capsules by mouth daily. Ultra Flora Balance Probiotic     No current facility-administered medications for this visit.      ALLERGIES: Patient has no known allergies.  Family History  Problem Relation Age of Onset  . Bladder Cancer Mother 23  . Lung cancer Father        smoker  . Ovarian cancer Cousin        paternal #1  . Colon cancer Cousin        paternal #2  . Breast cancer Cousin        maternal  . Cancer Maternal Grandmother        liver  . Lung cancer Maternal Grandfather        smoker    Social History  Socioeconomic History  . Marital status: Married    Spouse name: Not on file  . Number of children: Not on file  . Years of education: Not on file  . Highest education level: Not on file  Occupational History  . Not on file  Social Needs  . Financial resource strain: Not on file  . Food insecurity:    Worry: Not on file    Inability: Not on file  . Transportation needs:    Medical: Not on file    Non-medical: Not on file  Tobacco Use  . Smoking status: Never Smoker  . Smokeless tobacco: Never Used  Substance and Sexual Activity  . Alcohol use: Yes    Alcohol/week: 0.0 standard drinks    Comment: Occassionally  . Drug use: No  . Sexual activity: Yes    Partners: Male    Birth control/protection: Post-menopausal  Lifestyle  . Physical activity:    Days per week: Not on file    Minutes per session: Not on file  . Stress: Not on file  Relationships  . Social connections:    Talks on phone: Not on file    Gets together:  Not on file    Attends religious service: Not on file    Active member of club or organization: Not on file    Attends meetings of clubs or organizations: Not on file    Relationship status: Not on file  . Intimate partner violence:    Fear of current or ex partner: Not on file    Emotionally abused: Not on file    Physically abused: Not on file    Forced sexual activity: Not on file  Other Topics Concern  . Not on file  Social History Narrative  . Not on file    Review of Systems  Constitutional: Negative.   HENT: Negative.   Eyes: Negative.   Respiratory: Negative.   Cardiovascular: Negative.   Gastrointestinal: Negative.   Endocrine: Negative.   Genitourinary: Positive for dysuria, frequency and urgency.       Loss of urine with sneeze and cough  Musculoskeletal: Negative.   Skin: Negative.   Allergic/Immunologic: Negative.   Neurological: Negative.   Hematological: Negative.   Psychiatric/Behavioral: Negative.     PHYSICAL EXAMINATION:    LMP 05/24/2011     General appearance: alert, cooperative and appears stated age CVA: not tender Abdomen: soft, non-tender; non distended, no masses,  no organomegaly   Urine dip: + for blood, 2+ leuk  Urine culture from 05/14/18 + e. coli, sensitive to multiple antibiotics, including bactrim, macrobid and augmentin  ASSESSMENT Recurrent UTI, 2 weeks s/p treatment with macrobid    PLAN She doesn't tolerated bactrim, stomach upset Will treat with augmentin BID for 1 week Pyridium for pain Urine for ua, c&s Call if not better in 48 hours She will let her Orthopedic physician know about her UTI   An After Visit Summary was printed and given to the patient.

## 2018-06-12 LAB — URINALYSIS, MICROSCOPIC ONLY: Casts: NONE SEEN /lpf

## 2018-06-13 LAB — URINE CULTURE

## 2018-06-21 DIAGNOSIS — Z96643 Presence of artificial hip joint, bilateral: Secondary | ICD-10-CM | POA: Insufficient documentation

## 2018-07-02 ENCOUNTER — Other Ambulatory Visit: Payer: Self-pay | Admitting: Obstetrics and Gynecology

## 2018-07-02 NOTE — Telephone Encounter (Signed)
Medication refill request: Celexa  Last AEX:  06/17/17 Next AEX: 07/23/18 Last MMG (if hormonal medication request):10/14/17  Bi-rads 1 neg  Refill authorized: #[id number] with 0RF Please refill until AEX if appropriate

## 2018-07-22 NOTE — Progress Notes (Signed)
57 y.o. [id number] Married White or Caucasian Not Hispanic or Latino female here for annual exam.  S/P bilateral hip replacement 5 weeks ago. She is in PT, doing well, driving. No significant pain, occasionally needs tylenol.  No vaginal bleeding. Tolerable vasomotor symptoms. She has had recurrent UTI's in the last few months, on her 3rd round of antibiotics in about 2 months. Getting an ultrasound of her kidneys this afternoon.  Started on Celexa in 10/17 for dysthymia, doing well, wants to continue it.     Patient's last menstrual period was 05/24/2011.          Sexually active: Yes.    The current method of family planning is post menopausal status.    Exercising: Yes.    Physical therapy Smoker:  no  Health Maintenance: Pap:  06/27/2017 WNL, 06-20-16 WNL NEG HR HPV  History of abnormal Pap: yes- repeat PAP WNL  MMG:  10/14/2017 Birads 1 negative Colonoscopy:  2010 WNL BMD:   Years ago WNL TDaP:  11/29/2009 Gardasil: N/A   reports that she has never smoked. She has never used smokeless tobacco. She reports that she drinks alcohol. She reports that she does not use drugs. Rare ETOH. 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  2 daughters, both out of college  Past Medical History:  Diagnosis Date  . Arthritis   . Dry eyes   . Hypertension   . Microscopic hematuria 2012   w/u -    Past Surgical History:  Procedure Laterality Date  . COLONOSCOPY    . DILATATION & CURETTAGE/HYSTEROSCOPY WITH MYOSURE N/A 08/16/2015   Procedure: DILATATION & CURETTAGE/HYSTEROSCOPY WITH MYOSURE;  Surgeon: Romualdo BolkJill Evelyn Deamonte Sayegh, MD;  Location: [facility];  Service: Gynecology;  Laterality: N/A;  . TOTAL HIP ARTHROPLASTY Bilateral    anterior procedure  . WISDOM TOOTH EXTRACTION      Current Outpatient Medications  Medication Sig Dispense Refill  . aspirin 325 MG tablet Take 325 mg by mouth daily.    . ciprofloxacin (CIPRO) 500 MG tablet Take 500 mg by mouth 2 (two) times daily.    . citalopram (CELEXA) 20 MG  tablet TAKE ONE TABLET BY MOUTH EVERY DAY 30 tablet 0  . cycloSPORINE (RESTASIS) 0.05 % ophthalmic emulsion Place 1 drop into both eyes 2 (two) times daily.    Marland Kitchen. docusate sodium (COLACE) 100 MG capsule Take 100 mg by mouth 2 (two) times daily.    . ferrous sulfate 325 (65 FE) MG tablet Take 325 mg by mouth daily with breakfast.    . Glucosamine-Chondroitin (COSAMIN DS) 500-400 MG CAPS TAKE 3 CAPSULES EVERY DAY    . losartan (COZAAR) 50 MG tablet Take 50 mg by mouth daily.    Marland Kitchen. OVER THE COUNTER MEDICATION Take 2 capsules by mouth daily. Ultra Flora Balance Probiotic    . phenazopyridine (PYRIDIUM) 200 MG tablet Take 1 tablet (200 mg total) by mouth 3 (three) times daily as needed. 6 tablet 0   No current facility-administered medications for this visit.     Family History  Problem Relation Age of Onset  . Bladder Cancer Mother 2082  . Lung cancer Father        smoker  . Ovarian cancer Cousin        paternal #1  . Colon cancer Cousin        paternal #2  . Breast cancer Cousin        maternal  . Cancer Maternal Grandmother        liver  .  Lung cancer Maternal Grandfather        smoker    Review of Systems  Constitutional: Negative.   HENT: Negative.   Eyes: Negative.   Respiratory: Negative.   Cardiovascular: Negative.   Gastrointestinal: Negative.   Endocrine: Negative.   Genitourinary:       Loss of urine with cough/sneeze  Musculoskeletal: Negative.   Skin: Negative.   Allergic/Immunologic: Negative.   Neurological: Negative.   Hematological: Negative.   Psychiatric/Behavioral: Negative.     Exam:   BP 122/70 (BP Location: Right Arm, Patient Position: Sitting, Cuff Size: Normal)   Pulse 84   Ht 5\' 5"  (1.651 m)   Wt 165 lb (74.8 kg)   LMP 05/24/2011   BMI 27.46 kg/m   Weight change: @WEIGHTCHANGE @ Height:   Height: 5\' 5"  (165.1 cm)  Ht Readings from Last 3 Encounters:  07/23/18 5\' 5"  (1.651 m)  06/27/17 5\' 4"  (1.626 m)  03/15/17 5\' 3"  (1.6 m)    General  appearance: alert, cooperative and appears stated age Head: Normocephalic, without obvious abnormality, atraumatic Neck: no adenopathy, supple, symmetrical, trachea midline and thyroid normal to inspection and palpation Lungs: clear to auscultation bilaterally Cardiovascular: regular rate and rhythm Breasts: normal appearance, no masses or tenderness Abdomen: soft, non-tender; non distended,  no masses,  no organomegaly Extremities: extremities normal, atraumatic, no cyanosis or edema Skin: Skin color, texture, turgor normal. No rashes or lesions Lymph nodes: Cervical, supraclavicular, and axillary nodes normal. No abnormal inguinal nodes palpated Neurologic: Grossly normal   Pelvic: External genitalia:  no lesions              Urethra:  normal appearing urethra with no masses, tenderness or lesions              Bartholins and Skenes: normal                 Vagina: mildly atrophic appearing vagina with normal color and discharge, no lesions              Cervix: no lesions               Bimanual Exam:  Uterus:  normal size, contour, position, consistency, mobility, non-tender              Adnexa: no mass, fullness, tenderness               Rectovaginal: Confirms               Anus:  normal sphincter tone, no lesions  Chaperone was present for exam.  A:  Well Woman with normal exam  P:   Mammogram in 2/20  Colonoscopy next year  Discussed breast self exam  Discussed calcium and vit D intake  Continue Celexa  No pap this year

## 2018-07-23 ENCOUNTER — Encounter: Payer: Self-pay | Admitting: Obstetrics and Gynecology

## 2018-07-23 ENCOUNTER — Ambulatory Visit: Payer: BLUE CROSS/BLUE SHIELD | Admitting: Obstetrics and Gynecology

## 2018-07-23 ENCOUNTER — Other Ambulatory Visit: Payer: Self-pay

## 2018-07-23 VITALS — BP 122/70 | HR 84 | Ht 65.0 in | Wt 165.0 lb

## 2018-07-23 DIAGNOSIS — Z01419 Encounter for gynecological examination (general) (routine) without abnormal findings: Secondary | ICD-10-CM

## 2018-07-23 MED ORDER — CITALOPRAM HYDROBROMIDE 20 MG PO TABS: 20.0000 mg | ORAL_TABLET | Freq: Every day | ORAL | 3 refills | Status: DC

## 2018-07-23 NOTE — Patient Instructions (Signed)
EXERCISE AND DIET:  We recommended that you start or continue a regular exercise program for good health. Regular exercise means any activity that makes your heart beat faster and makes you sweat.  We recommend exercising at least 30 minutes per day at least 3 days a week, preferably 4 or 5.  We also recommend a diet low in fat and sugar.  Inactivity, poor dietary choices and obesity can cause diabetes, heart attack, stroke, and kidney damage, among others.    ALCOHOL AND SMOKING:  Women should limit their alcohol intake to no more than 7 drinks/beers/glasses of wine (combined, not each!) per week. Moderation of alcohol intake to this level decreases your risk of breast cancer and liver damage. And of course, no recreational drugs are part of a healthy lifestyle.  And absolutely no smoking or even second hand smoke. Most people know smoking can cause heart and lung diseases, but did you know it also contributes to weakening of your bones? Aging of your skin?  Yellowing of your teeth and nails?  CALCIUM AND VITAMIN D:  Adequate intake of calcium and Vitamin D are recommended.  The recommendations for exact amounts of these supplements seem to change often, but generally speaking 1,200 mg of calcium (between diet and supplement) and 800 units of Vitamin D per day seems prudent. Certain women may benefit from higher intake of Vitamin D.  If you are among these women, your doctor will have told you during your visit.    PAP SMEARS:  Pap smears, to check for cervical cancer or precancers,  have traditionally been done yearly, although recent scientific advances have shown that most women can have pap smears less often.  However, every woman still should have a physical exam from her gynecologist every year. It will include a breast check, inspection of the vulva and vagina to check for abnormal growths or skin changes, a visual exam of the cervix, and then an exam to evaluate the size and shape of the uterus and  ovaries.  And after 59 years of age, a rectal exam is indicated to check for rectal cancers. We will also provide age appropriate advice regarding health maintenance, like when you should have certain vaccines, screening for sexually transmitted diseases, bone density testing, colonoscopy, mammograms, etc.   MAMMOGRAMS:  All women over 40 years old should have a yearly mammogram. Many facilities now offer a "3D" mammogram, which may cost around $50 extra out of pocket. If possible,  we recommend you accept the option to have the 3D mammogram performed.  It both reduces the number of women who will be called back for extra views which then turn out to be normal, and it is better than the routine mammogram at detecting truly abnormal areas.    COLONOSCOPY:  Colonoscopy to screen for colon cancer is recommended for all women at age 50.  We know, you hate the idea of the prep.  We agree, BUT, having colon cancer and not knowing it is worse!!  Colon cancer so often starts as a polyp that can be seen and removed at colonscopy, which can quite literally save your life!  And if your first colonoscopy is normal and you have no family history of colon cancer, most women don't have to have it again for 10 years.  Once every ten years, you can do something that may end up saving your life, right?  We will be happy to help you get it scheduled when you are ready.    Be sure to check your insurance coverage so you understand how much it will cost.  It may be covered as a preventative service at no cost, but you should check your particular policy.      Breast Self-Awareness Breast self-awareness means being familiar with how your breasts look and feel. It involves checking your breasts regularly and reporting any changes to your health care provider. Practicing breast self-awareness is important. A change in your breasts can be a sign of a serious medical problem. Being familiar with how your breasts look and feel allows  you to find any problems early, when treatment is more likely to be successful. All women should practice breast self-awareness, including women who have had breast implants. How to do a breast self-exam One way to learn what is normal for your breasts and whether your breasts are changing is to do a breast self-exam. To do a breast self-exam: Look for Changes  1. Remove all the clothing above your waist. 2. Stand in front of a mirror in a room with good lighting. 3. Put your hands on your hips. 4. Push your hands firmly downward. 5. Compare your breasts in the mirror. Look for differences between them (asymmetry), such as: ? Differences in shape. ? Differences in size. ? Puckers, dips, and bumps in one breast and not the other. 6. Look at each breast for changes in your skin, such as: ? Redness. ? Scaly areas. 7. Look for changes in your nipples, such as: ? Discharge. ? Bleeding. ? Dimpling. ? Redness. ? A change in position. Feel for Changes  Carefully feel your breasts for lumps and changes. It is best to do this while lying on your back on the floor and again while sitting or standing in the shower or tub with soapy water on your skin. Feel each breast in the following way:  Place the arm on the side of the breast you are examining above your head.  Feel your breast with the other hand.  Start in the nipple area and make  inch (2 cm) overlapping circles to feel your breast. Use the pads of your three middle fingers to do this. Apply light pressure, then medium pressure, then firm pressure. The light pressure will allow you to feel the tissue closest to the skin. The medium pressure will allow you to feel the tissue that is a little deeper. The firm pressure will allow you to feel the tissue close to the ribs.  Continue the overlapping circles, moving downward over the breast until you feel your ribs below your breast.  Move one finger-width toward the center of the body.  Continue to use the  inch (2 cm) overlapping circles to feel your breast as you move slowly up toward your collarbone.  Continue the up and down exam using all three pressures until you reach your armpit.  Write Down What You Find  Write down what is normal for each breast and any changes that you find. Keep a written record with breast changes or normal findings for each breast. By writing this information down, you do not need to depend only on memory for size, tenderness, or location. Write down where you are in your menstrual cycle, if you are still menstruating. If you are having trouble noticing differences in your breasts, do not get discouraged. With time you will become more familiar with the variations in your breasts and more comfortable with the exam. How often should I examine my breasts? Examine   your breasts every month. If you are breastfeeding, the best time to examine your breasts is after a feeding or after using a breast pump. If you menstruate, the best time to examine your breasts is 5-7 days after your period is over. During your period, your breasts are lumpier, and it may be more difficult to notice changes. When should I see my health care provider? See your health care provider if you notice:  A change in shape or size of your breasts or nipples.  A change in the skin of your breast or nipples, such as a reddened or scaly area.  Unusual discharge from your nipples.  A lump or thick area that was not there before.  Pain in your breasts.  Anything that concerns you.  This information is not intended to replace advice given to you by your health care provider. Make sure you discuss any questions you have with your health care provider. Document Released: 08/20/2005 Document Revised: 01/26/2016 Document Reviewed: 07/10/2015 Elsevier Interactive Patient Education  2018 [employer] Inc[employer]  

## 2018-09-05 ENCOUNTER — Other Ambulatory Visit: Payer: Self-pay | Admitting: Nurse Practitioner

## 2018-09-05 DIAGNOSIS — R5381 Other malaise: Secondary | ICD-10-CM

## 2018-09-09 ENCOUNTER — Other Ambulatory Visit: Payer: Self-pay | Admitting: Nurse Practitioner

## 2018-09-09 DIAGNOSIS — Z780 Asymptomatic menopausal state: Secondary | ICD-10-CM

## 2018-09-09 DIAGNOSIS — E2839 Other primary ovarian failure: Secondary | ICD-10-CM

## 2018-09-19 ENCOUNTER — Other Ambulatory Visit: Payer: Self-pay | Admitting: Obstetrics and Gynecology

## 2018-09-19 DIAGNOSIS — Z1231 Encounter for screening mammogram for malignant neoplasm of breast: Secondary | ICD-10-CM

## 2018-10-14 ENCOUNTER — Ambulatory Visit
Admission: RE | Admit: 2018-10-14 | Discharge: 2018-10-14 | Disposition: A | Discharge status: Home / Self Care | Payer: BLUE CROSS/BLUE SHIELD | Source: Ambulatory Visit | Attending: Nurse Practitioner | Admitting: Nurse Practitioner

## 2018-10-14 ENCOUNTER — Ambulatory Visit
Admission: RE | Admit: 2018-10-14 | Discharge: 2018-10-14 | Disposition: A | Discharge status: Home / Self Care | Payer: BLUE CROSS/BLUE SHIELD | Source: Ambulatory Visit

## 2018-10-14 DIAGNOSIS — Z1231 Encounter for screening mammogram for malignant neoplasm of breast: Secondary | ICD-10-CM

## 2018-10-14 DIAGNOSIS — E2839 Other primary ovarian failure: Secondary | ICD-10-CM

## 2018-10-14 DIAGNOSIS — Z780 Asymptomatic menopausal state: Secondary | ICD-10-CM

## 2018-11-04 ENCOUNTER — Ambulatory Visit: Payer: BLUE CROSS/BLUE SHIELD | Admitting: Obstetrics and Gynecology

## 2018-11-04 ENCOUNTER — Encounter: Payer: Self-pay | Admitting: Obstetrics and Gynecology

## 2018-11-04 ENCOUNTER — Other Ambulatory Visit: Payer: Self-pay

## 2018-11-04 VITALS — BP 128/78 | HR 84 | Temp 98.8°F | Wt 166.8 lb

## 2018-11-04 DIAGNOSIS — R350 Frequency of micturition: Secondary | ICD-10-CM

## 2018-11-04 DIAGNOSIS — N3090 Cystitis, unspecified without hematuria: Secondary | ICD-10-CM | POA: Diagnosis not present

## 2018-11-04 DIAGNOSIS — R300 Dysuria: Secondary | ICD-10-CM

## 2018-11-04 DIAGNOSIS — R3915 Urgency of urination: Secondary | ICD-10-CM | POA: Diagnosis not present

## 2018-11-04 LAB — POCT URINALYSIS DIPSTICK
Bilirubin, UA: NEGATIVE
Blood, UA: POSITIVE
Glucose, UA: NEGATIVE
Ketones, UA: NEGATIVE
Nitrite, UA: NEGATIVE
PROTEIN UA: NEGATIVE
Spec Grav, UA: 1.01 (ref 1.010–1.025)
Urobilinogen, UA: NEGATIVE E.U./dL — AB
pH, UA: 5 (ref 5.0–8.0)

## 2018-11-04 MED ORDER — PHENAZOPYRIDINE HCL 200 MG PO TABS: 200.0000 mg | ORAL_TABLET | Freq: Three times a day (TID) | ORAL | 0 refills | Status: DC | PRN

## 2018-11-04 MED ORDER — NITROFURANTOIN MONOHYD MACRO 100 MG PO CAPS: 100.0000 mg | ORAL_CAPSULE | Freq: Two times a day (BID) | ORAL | 0 refills | Status: DC

## 2018-11-04 NOTE — Patient Instructions (Signed)

## 2018-11-04 NOTE — Progress Notes (Signed)
GYNECOLOGY  VISIT   HPI: 58 y.o.   Married White or Caucasian Not Hispanic or Latino  female   [phone] with Patient's last menstrual period was 05/24/2011.   here for UTI symptoms that began on 11/02/2018.  She c/o SP discomfort, pressure, urinary urgency and frequency and pain with urination (more pain then burning). Feels inflamed. No fever or flank pain.  She had a UTI in 10/19. She had another UTI in 12/19 (several weeks after hip surgery), needed to be treated twice.  Not sexually active currently (since hip surgery). Previously used lubricant, still had some discomfort. She has seen Urology in the past for recurrent UTI's and hematuria.    GYNECOLOGIC HISTORY: Patient's last menstrual period was 05/24/2011. Contraception: Postmenopausal Menopausal hormone therapy: None        OB History    Gravida  2   Para  2   Term  2   Preterm      AB      Living  2     SAB      TAB      Ectopic      Multiple      Live Births  2              Patient Active Problem List   Diagnosis Date Noted  . History of depression 06/27/2017  . Primary osteoarthritis of hips, bilateral 10/16/2016  . Other and unspecified hyperlipidemia 05/27/2013  . Need for prophylactic hormone replacement therapy (postmenopausal) 05/27/2013  . Unspecified essential hypertension 05/15/2013  . Benign microscopic hematuria 05/15/2013    Past Medical History:  Diagnosis Date  . Arthritis   . Dry eyes   . Hypertension   . Microscopic hematuria 2012   w/u -    Past Surgical History:  Procedure Laterality Date  . COLONOSCOPY    . DILATATION & CURETTAGE/HYSTEROSCOPY WITH MYOSURE N/A 08/16/2015   Procedure: DILATATION & CURETTAGE/HYSTEROSCOPY WITH MYOSURE;  Surgeon: Romualdo Bolk, MD;  Location: [facility];  Service: Gynecology;  Laterality: N/A;  . TOTAL HIP ARTHROPLASTY Bilateral    anterior procedure  . WISDOM TOOTH EXTRACTION      Current Outpatient Medications  Medication Sig Dispense  Refill  . CALCIUM PO Take by mouth daily.    . citalopram (CELEXA) 20 MG tablet Take 1 tablet (20 mg total) by mouth daily. 90 tablet 3  . cycloSPORINE (RESTASIS) 0.05 % ophthalmic emulsion Place 1 drop into both eyes 2 (two) times daily.    Marland Kitchen docusate sodium (COLACE) 100 MG capsule Take 100 mg by mouth 2 (two) times daily.    . Glucosamine-Chondroitin (COSAMIN DS) 500-400 MG CAPS TAKE 3 CAPSULES EVERY DAY    . losartan (COZAAR) 50 MG tablet Take 50 mg by mouth daily.    Marland Kitchen OVER THE COUNTER MEDICATION Take 2 capsules by mouth daily. Ultra Flora Balance Probiotic     No current facility-administered medications for this visit.      ALLERGIES: Patient has no known allergies.  Family History  Problem Relation Age of Onset  . Bladder Cancer Mother 53  . Lung cancer Father        smoker  . Ovarian cancer Cousin        paternal #1  . Colon cancer Cousin        paternal #2  . Breast cancer Cousin        maternal  . Cancer Maternal Grandmother        liver  .  Lung cancer Maternal Grandfather        smoker    Social History   Socioeconomic History  . Marital status: Married    Spouse name: Not on file  . Number of children: Not on file  . Years of education: Not on file  . Highest education level: Not on file  Occupational History  . Not on file  Social Needs  . Financial resource strain: Not on file  . Food insecurity:    Worry: Not on file    Inability: Not on file  . Transportation needs:    Medical: Not on file    Non-medical: Not on file  Tobacco Use  . Smoking status: Never Smoker  . Smokeless tobacco: Never Used  Substance and Sexual Activity  . Alcohol use: Yes    Alcohol/week: 0.0 standard drinks    Comment: Occassionally  . Drug use: No  . Sexual activity: Yes    Partners: Male    Birth control/protection: Post-menopausal  Lifestyle  . Physical activity:    Days per week: Not on file    Minutes per session: Not on file  . Stress: Not on file   Relationships  . Social connections:    Talks on phone: Not on file    Gets together: Not on file    Attends religious service: Not on file    Active member of club or organization: Not on file    Attends meetings of clubs or organizations: Not on file    Relationship status: Not on file  . Intimate partner violence:    Fear of current or ex partner: Not on file    Emotionally abused: Not on file    Physically abused: Not on file    Forced sexual activity: Not on file  Other Topics Concern  . Not on file  Social History Narrative  . Not on file    Review of Systems  Eyes: Negative.   Respiratory: Negative.   Cardiovascular: Negative.   Gastrointestinal: Negative.   Genitourinary: Positive for dysuria, frequency and urgency.       Loss of urine with cough/sneeze  Musculoskeletal: Negative.   Neurological: Negative.   Endo/Heme/Allergies: Negative.   Psychiatric/Behavioral: Negative.     PHYSICAL EXAMINATION:    BP 128/78 (BP Location: Right Arm, Patient Position: Sitting, Cuff Size: Normal)   Pulse 84   Temp 98.8 F (37.1 C)   Wt 166 lb 12.8 oz (75.7 kg)   LMP 05/24/2011   BMI 27.76 kg/m     General appearance: alert, cooperative and appears stated age Abdomen: soft, non-tender; non distended, no masses,  no organomegaly CVA: Not tender  Urine dip 2+ leuk, +blood  ASSESSMENT UTI, this is her 3rd one since 10/19. Had hip replacement since 10/19.  Has seen urology for microscopic hematuria in the past with negative evaluation H/O entry dyspareunia, not currently sexually active.     PLAN Treat with macrobid and pyridium Call with fever or mid back pain Discussed possible referral to Urology if she gets another UTI in the next few months Discussed use of vaginal estrogen, would help dyspareunia and likely help prevent UTI She will call if she wants to retry vaginal estrogen (tablets)   An After Visit Summary was printed and given to the patient.

## 2018-11-05 LAB — URINALYSIS, MICROSCOPIC ONLY
Casts: NONE SEEN /lpf
WBC, UA: >30 /hpf — AB (ref 0–5)

## 2018-11-06 LAB — URINE CULTURE

## 2019-02-14 ENCOUNTER — Telehealth: Payer: Self-pay | Admitting: Obstetrics & Gynecology

## 2019-02-14 ENCOUNTER — Encounter: Payer: Self-pay | Admitting: Obstetrics & Gynecology

## 2019-02-14 MED ORDER — NITROFURANTOIN MONOHYD MACRO 100 MG PO CAPS: 100.0000 mg | ORAL_CAPSULE | Freq: Two times a day (BID) | ORAL | 0 refills | Status: DC

## 2019-02-14 NOTE — Telephone Encounter (Signed)
58 yo G2P2 MWF with h/o recurrent UTI called with onset of urinary dysuria, urgency and frequency who would like treatment over the weekend.  Has been treated for UTIs several times over the past year.  Denies fever, back or pelvic pain.  She was treated in October, December and then again in March.  Notes reviewed.  Urine cultures reviewed.  Cultures have been positive for different bacteria.  She does have sensitivity to Bactrim as it causes nausea and GI distress.  Rx for macrobid 100mg  bid x 5 days sent to [facility] in [place].  Pt confirmed this was the specific pharmacy she desired it to be sent to today.  Has my number to call if not improved in 24 hours.

## 2019-03-19 ENCOUNTER — Telehealth: Payer: Self-pay | Admitting: Obstetrics and Gynecology

## 2019-03-19 NOTE — Telephone Encounter (Signed)
Patient is returning a call to Jill. °

## 2019-03-19 NOTE — Telephone Encounter (Signed)
Spoke with patient. Reports hx of recurrent UTIs, symptoms started again today. Reports pressure, urgeny, discomfort and burning with urination. Denies flank pain, fever/chills, blood in urine, N/V. Took Azo at 12p for symptom relief. Last tx by telephone on 02/14/19 with macrobid. Reports she has been seen by urology in the past with no changes.   Advised OV needed for further evaluation. OV scheduled for 7/17 at 4:30pm with Dr. Sabra Heck. Advised patient I will update provider, our office will return call if any additional recommendations.  Routing to provider for final review. Patient is agreeable to disposition. Will close encounter.  Cc: Dr. Talbert Nan

## 2019-03-19 NOTE — Telephone Encounter (Signed)
Left message to call Kathryn Bilal, RN at [facility] [phone].   

## 2019-03-19 NOTE — Telephone Encounter (Signed)
Patient want to discuss her next step for recurrent UTIs.

## 2019-03-20 ENCOUNTER — Ambulatory Visit: Payer: BC Managed Care – PPO | Admitting: Obstetrics & Gynecology

## 2019-03-20 ENCOUNTER — Other Ambulatory Visit: Payer: Self-pay

## 2019-03-20 VITALS — BP 115/64 | HR 74 | Temp 97.4°F | Ht 65.0 in | Wt 156.2 lb

## 2019-03-20 DIAGNOSIS — N390 Urinary tract infection, site not specified: Secondary | ICD-10-CM

## 2019-03-20 DIAGNOSIS — R300 Dysuria: Secondary | ICD-10-CM

## 2019-03-20 MED ORDER — NITROFURANTOIN MONOHYD MACRO 100 MG PO CAPS: 100.0000 mg | ORAL_CAPSULE | Freq: Two times a day (BID) | ORAL | 0 refills | Status: DC

## 2019-03-20 NOTE — Progress Notes (Signed)
GYNECOLOGY  VISIT  CC:   UTI   HPI: 58 y.o. [id number] Married White or Caucasian female here for UTI symptoms including urinary urgency and dysuria.  She denies back pain or fever or pelvic pain.  She has been taking AZO and her urine is very orange today.  She denies hematuria.  This has been on ongoing issue for the past several years.  Review records shows at least six UTIs in the past 18 months (including Care Everywhere).  She is frustrated with this and wants to discuss options for treatment.  Increased water intake, vaginal estrogen, probiotic, and prophylactic antibiotics discussed.     She had a renal ultrasound in 07/2018 ordered by her PCP.  She has seen urology x 2 in the past, although several years ago.  She did have a CT in 2012 with the most recent urology evaluation.  This was normal.  Denies vaginal bleeding or discharge.  Current UTI symptoms are no related to intercourse.  GYNECOLOGIC HISTORY: Patient's last menstrual period was 05/24/2011. Contraception: PMP Menopausal hormone therapy: none  Patient Active Problem List   Diagnosis Date Noted  . History of depression 06/27/2017  . Primary osteoarthritis of hips, bilateral 10/16/2016  . Other and unspecified hyperlipidemia 05/27/2013  . Need for prophylactic hormone replacement therapy (postmenopausal) 05/27/2013  . Unspecified essential hypertension 05/15/2013  . Benign microscopic hematuria 05/15/2013    Past Medical History:  Diagnosis Date  . Arthritis   . Dry eyes   . Hypertension   . Microscopic hematuria 2012   w/u -    Past Surgical History:  Procedure Laterality Date  . COLONOSCOPY    . DILATATION & CURETTAGE/HYSTEROSCOPY WITH MYOSURE N/A 08/16/2015   Procedure: DILATATION & CURETTAGE/HYSTEROSCOPY WITH MYOSURE;  Surgeon: Romualdo [name], MD;  Location: [facility];  Service: Gynecology;  Laterality: N/A;  . TOTAL HIP ARTHROPLASTY Bilateral    anterior procedure  . WISDOM TOOTH EXTRACTION       MEDS:   Current Outpatient Medications on File Prior to Visit  Medication Sig Dispense Refill  . CALCIUM PO Take by mouth daily.    . citalopram (CELEXA) 20 MG tablet Take 1 tablet (20 mg total) by mouth daily. 90 tablet 3  . cycloSPORINE (RESTASIS) 0.05 % ophthalmic emulsion Place 1 drop into both eyes 2 (two) times daily.    Marland Kitchen. docusate sodium (COLACE) 100 MG capsule Take 100 mg by mouth 2 (two) times daily.    . Glucosamine-Chondroitin (COSAMIN DS) 500-400 MG CAPS TAKE 3 CAPSULES EVERY DAY    . losartan (COZAAR) 50 MG tablet Take 50 mg by mouth daily.    . nitrofurantoin, macrocrystal-monohydrate, (MACROBID) 100 MG capsule Take 1 capsule (100 mg total) by mouth 2 (two) times daily. 10 capsule 0  . OVER THE COUNTER MEDICATION Take 2 capsules by mouth daily. Ultra Flora Balance Probiotic    . phenazopyridine (PYRIDIUM) 200 MG tablet Take 1 tablet (200 mg total) by mouth 3 (three) times daily as needed. 6 tablet 0   No current facility-administered medications on file prior to visit.     ALLERGIES: Bactrim [sulfamethoxazole-trimethoprim]  Family History  Problem Relation Age of Onset  . Bladder Cancer Mother [id number]  . Lung cancer Father        smoker  . Ovarian cancer Cousin        paternal #1  . Colon cancer Cousin        paternal #2  . Breast cancer Cousin  maternal  . Cancer Maternal Grandmother        liver  . Lung cancer Maternal Grandfather        smoker    SH:  Married, non smoker  Review of Systems  All other systems reviewed and are negative.   PHYSICAL EXAMINATION:    Ht 5\' 5"  (1.651 m)   LMP 05/24/2011   BMI 27.76 kg/m     General appearance: alert, cooperative and appears stated age Flank:  No CVA tenderness Abdomen: soft, non-tender; bowel sounds normal; no masses,  no organomegaly Lymph:  no inguinal LAD noted  Pelvic: External genitalia:  no lesions, skin is stained yellow              Urethra:  normal appearing urethra with no masses,  tenderness or lesions              Bartholins and Skenes: normal                 Vagina: normal appearing vagina with normal color and discharge, no lesions              Cervix: no lesions              Bimanual Exam:  Uterus:  normal size, contour, position, consistency, mobility, non-tender              Adnexa: no mass, fullness, tenderness  Chaperone was present for exam.  Assessment: Cystitis with h/o recurrent UTIs  Plan: Macrobid 100mg  bid x 5 days Urine micro and culture pending.  Results will be called to pt. Options for prophylactic antibiotics discussed.  She thinks this is something she would like to proceed with after current UTI is treated.  We discussed possible use of trimethoprim.   ~25 minutes spent with patient >50% of time was in face to face discussion of recurrent UTIs and possible treatment options.

## 2019-03-21 LAB — URINALYSIS, MICROSCOPIC ONLY
Casts: NONE SEEN /lpf
WBC, UA: >30 /hpf — AB (ref 0–5)

## 2019-03-22 ENCOUNTER — Encounter: Payer: Self-pay | Admitting: Obstetrics & Gynecology

## 2019-03-22 LAB — URINE CULTURE

## 2019-04-01 ENCOUNTER — Other Ambulatory Visit: Payer: Self-pay

## 2019-04-01 ENCOUNTER — Other Ambulatory Visit: Payer: BC Managed Care – PPO

## 2019-04-01 DIAGNOSIS — R300 Dysuria: Secondary | ICD-10-CM

## 2019-04-01 NOTE — Progress Notes (Signed)
Patient here to drop repeat urin off for micro and culture after finishing antibiotic 7 days ago. She denies any symptoms.   Routing to provider for final review.

## 2019-04-02 LAB — URINALYSIS, MICROSCOPIC ONLY
Bacteria, UA: NONE SEEN
Casts: NONE SEEN /lpf
Epithelial Cells (non renal): NONE SEEN /hpf (ref 0–10)

## 2019-04-02 LAB — URINE CULTURE

## 2019-04-03 ENCOUNTER — Other Ambulatory Visit: Payer: Self-pay | Admitting: Obstetrics & Gynecology

## 2019-04-03 MED ORDER — TRIMETHOPRIM 100 MG PO TABS: 50.0000 mg | ORAL_TABLET | Freq: Every day | ORAL | 1 refills | Status: DC

## 2019-06-02 ENCOUNTER — Encounter: Payer: Self-pay | Admitting: Gynecology

## 2019-06-14 ENCOUNTER — Other Ambulatory Visit: Payer: Self-pay

## 2019-06-14 ENCOUNTER — Emergency Department
Admission: EM | Admit: 2019-06-14 | Discharge: 2019-06-14 | Disposition: A | Discharge status: Home / Self Care | Payer: BC Managed Care – PPO | Source: Home / Self Care

## 2019-06-14 DIAGNOSIS — R300 Dysuria: Secondary | ICD-10-CM | POA: Diagnosis not present

## 2019-06-14 DIAGNOSIS — N3000 Acute cystitis without hematuria: Secondary | ICD-10-CM | POA: Diagnosis not present

## 2019-06-14 LAB — POCT URINALYSIS DIP (MANUAL ENTRY)
Bilirubin, UA: NEGATIVE
Glucose, UA: NEGATIVE mg/dL
Nitrite, UA: POSITIVE — AB
Protein Ur, POC: 30 mg/dL — AB
Spec Grav, UA: 1.025 (ref 1.010–1.025)
Urobilinogen, UA: 0.2 E.U./dL
pH, UA: 5.5 (ref 5.0–8.0)

## 2019-06-14 MED ORDER — CIPROFLOXACIN HCL 500 MG PO TABS: 500.0000 mg | ORAL_TABLET | Freq: Two times a day (BID) | ORAL | 0 refills | Status: DC

## 2019-06-14 NOTE — ED Provider Notes (Signed)
[facility]    CSN: [id number] Arrival date & time: 06/14/19  1456      History   Chief Complaint Chief Complaint  Patient presents with  . Urinary Frequency  . Dysuria    HPI Kathryn Odom is a 58 y.o. female.   This is the initial [facility] urgent care visit for this 58 year old woman.  She presents with UTI symptoms.  Pt c/o dysuria x 2 days. Urgency and burning. PCP recommended coming to UC and getting ucx.     Past Medical History:  Diagnosis Date  . Arthritis   . Dry eyes   . Hypertension   . Microscopic hematuria 2012   w/u -    Patient Active Problem List   Diagnosis Date Noted  . Status post bilateral hip replacements 06/21/2018  . History of depression 06/27/2017  . Primary osteoarthritis of hips, bilateral 10/16/2016  . Other and unspecified hyperlipidemia 05/27/2013  . Need for prophylactic hormone replacement therapy (postmenopausal) 05/27/2013  . Unspecified essential hypertension 05/15/2013  . Benign microscopic hematuria 05/15/2013    Past Surgical History:  Procedure Laterality Date  . COLONOSCOPY    . DILATATION & CURETTAGE/HYSTEROSCOPY WITH MYOSURE N/A 08/16/2015   Procedure: DILATATION & CURETTAGE/HYSTEROSCOPY WITH MYOSURE;  Surgeon: Romualdo [name], MD;  Location: [facility];  Service: Gynecology;  Laterality: N/A;  . TOTAL HIP ARTHROPLASTY Bilateral    anterior procedure  . WISDOM TOOTH EXTRACTION      OB History    Gravida  2   Para  2   Term  2   Preterm      AB      Living  2     SAB      TAB      Ectopic      Multiple      Live Births  2            Home Medications    Prior to Admission medications   Medication Sig Start Date End Date Taking? Authorizing Provider  CALCIUM PO Take by mouth daily.    [provider]  ciprofloxacin (CIPRO) 500 MG tablet Take 1 tablet (500 mg total) by mouth 2 (two) times daily. 06/14/19   Elvina SidleLauenstein, Cederick Broadnax, MD  citalopram (CELEXA) 20 MG  tablet Take 1 tablet (20 mg total) by mouth daily. 07/23/18   Romualdo BolkJertson, Jill Evelyn, MD  cycloSPORINE (RESTASIS) 0.05 % ophthalmic emulsion Place 1 drop into both eyes 2 (two) times daily.    [provider]  docusate sodium (COLACE) 100 MG capsule Take 100 mg by mouth 2 (two) times daily.    [provider]  Glucosamine-Chondroitin (COSAMIN DS) 500-400 MG CAPS TAKE 3 CAPSULES EVERY DAY 03/12/17   [provider]  losartan (COZAAR) 50 MG tablet Take 50 mg by mouth daily.    [provider]  OVER THE COUNTER MEDICATION Take 2 capsules by mouth daily. Ultra Flora Balance Probiotic    [provider]  phenazopyridine (PYRIDIUM) 200 MG tablet Take 1 tablet (200 mg total) by mouth 3 (three) times daily as needed. 11/04/18   Romualdo BolkJertson, Jill Evelyn, MD  trimethoprim (TRIMPEX) 100 MG tablet Take 0.5 tablets (50 mg total) by mouth daily. 04/03/19   Jerene BearsMiller, Mary S, MD    Family History Family History  Problem Relation Age of Onset  . Bladder Cancer Mother 3382  . Lung cancer Father        smoker  . Ovarian cancer Cousin  paternal #1  . Colon cancer Cousin        paternal #2  . Breast cancer Cousin        maternal  . Cancer Maternal Grandmother        liver  . Lung cancer Maternal Grandfather        smoker    Social History Social History   Tobacco Use  . Smoking status: Never Smoker  . Smokeless tobacco: Never Used  Substance Use Topics  . Alcohol use: Yes    Alcohol/week: 0.0 standard drinks    Comment: Occassionally  . Drug use: No     Allergies   Bactrim [sulfamethoxazole-trimethoprim]   Review of Systems Review of Systems  Constitutional: Negative.   Gastrointestinal: Negative for abdominal pain, nausea and vomiting.  Genitourinary: Positive for dysuria and frequency.  Musculoskeletal: Negative for back pain.  All other systems reviewed and are negative.    Physical Exam Triage Vital Signs ED Triage Vitals  Enc Vitals  Group     BP 06/14/19 1512 127/74     Pulse Rate 06/14/19 1512 83     Resp 06/14/19 1512 18     Temp 06/14/19 1512 98.4 F (36.9 C)     Temp Source 06/14/19 1512 Oral     SpO2 06/14/19 1512 96 %     Weight 06/14/19 1513 140 lb (63.5 kg)     Height 06/14/19 1513 5\' 5"  (1.651 m)     Head Circumference --      Peak Flow --      Pain Score 06/14/19 1513 0     Pain Loc --      Pain Edu? --      Excl. in GC? --    No data found.  Updated Vital Signs BP 127/74 (BP Location: Right Arm)   Pulse 83   Temp 98.4 F (36.9 C) (Oral)   Resp 18   Ht 5\' 5"  (1.651 m)   Wt 63.5 kg   LMP 05/24/2011   SpO2 96%   BMI 23.30 kg/m    Physical Exam Vitals signs and nursing note reviewed.  Constitutional:      Appearance: Normal appearance.  Eyes:     Conjunctiva/sclera: Conjunctivae normal.  Neck:     Musculoskeletal: Normal range of motion and neck supple.  Cardiovascular:     Pulses: Normal pulses.  Musculoskeletal: Normal range of motion.  Skin:    General: Skin is warm and dry.  Neurological:     General: No focal deficit present.     Mental Status: She is alert and oriented to person, place, and time.  Psychiatric:        Mood and Affect: Mood normal.        Behavior: Behavior normal.        Thought Content: Thought content normal.        Judgment: Judgment normal.      UC Treatments / Results  Labs (all labs ordered are listed, but only abnormal results are displayed) Labs Reviewed  URINE CULTURE    EKG   Radiology No results found.  Procedures Procedures (including critical care time)  Medications Ordered in UC Medications - No data to display  Initial Impression / Assessment and Plan / UC Course  I have reviewed the triage vital signs and the nursing notes.  Pertinent labs & imaging results that were available during my care of the patient were reviewed by me and considered in my medical decision making (  see chart for details).    Final Clinical  Impressions(s) / UC Diagnoses   Final diagnoses:  Dysuria  Acute cystitis without hematuria   Discharge Instructions   None    ED Prescriptions    Medication Sig Dispense Auth. Provider   ciprofloxacin (CIPRO) 500 MG tablet Take 1 tablet (500 mg total) by mouth 2 (two) times daily. 14 tablet Robyn Haber, MD     I have reviewed the PDMP during this encounter.   Robyn Haber, MD 06/14/19 1531

## 2019-06-14 NOTE — ED Triage Notes (Signed)
Pt c/o dysuria x 2 days. Urgency and burning. PCP recommended coming to UC and getting ucx.

## 2019-06-17 LAB — URINE CULTURE
MICRO NUMBER:: 978470
SPECIMEN QUALITY:: ADEQUATE

## 2019-06-18 ENCOUNTER — Telehealth (HOSPITAL_COMMUNITY): Payer: Self-pay | Admitting: Emergency Medicine

## 2019-06-18 NOTE — Telephone Encounter (Signed)
Urine culture was positive for e coli and was given cipro  at urgent care visit. Attempted to reach patient to see how she was feeling. No answer at this time. Mychart comment sent.

## 2019-07-19 ENCOUNTER — Other Ambulatory Visit: Payer: Self-pay | Admitting: Obstetrics & Gynecology

## 2019-07-20 NOTE — Telephone Encounter (Signed)
Medication refill request: Trimethoprim  Last AEX:  07-23-18 JJ  Next AEX: 08-05-2019  Last MMG (if hormonal medication request): n/a Refill authorized: Today, please advise.   Medication pended for #30, 0RF. Please refill if appropriate.

## 2019-08-03 ENCOUNTER — Other Ambulatory Visit: Payer: Self-pay | Admitting: Obstetrics and Gynecology

## 2019-08-03 NOTE — Telephone Encounter (Signed)
Spoke with patient. Patient states she has enough Celexa to last until her appointment on 08-05-2019 with Dr. Talbert Nan. RN advised would update the pharmacy. Patient agreeable.

## 2019-08-04 ENCOUNTER — Other Ambulatory Visit: Payer: Self-pay

## 2019-08-04 NOTE — Progress Notes (Signed)
58 y.o. G85P2002 Married White or Caucasian Not Hispanic or Latino female here for annual exam.   She has lost 30 lbs, new hips are working well.  She is on daily antibiotics for recurrent UTI's. She has had one UTI since starting suppression.    Doing well on Celexa.   Sexually active, no pain. No vaginal bleeding.   Patient's last menstrual period was 05/24/2011.          Sexually active: Yes.    The current method of family planning is post menopausal status.    Exercising: Yes.    walking Smoker:  no  Health Maintenance: Pap:06/27/2017 WNL, 06-20-16 WNL NEG HR HPV History of abnormal Pap:yes- repeat PAP WNL MMG:10/14/2018 Birads 1 negative Colonoscopy:2011 WNL, due in 3/21, she will schedule BMD:10/14/2018 WNL TDaP:11/29/2009 Gardasil:N/A   reports that she has never smoked. She has never used smokeless tobacco. She reports current alcohol use. She reports that she does not use drugs. Director ofprogram management. 2 adult daughters  Past Medical History:  Diagnosis Date  . Arthritis   . Dry eyes   . Hypertension   . Microscopic hematuria 2012   w/u -    Past Surgical History:  Procedure Laterality Date  . COLONOSCOPY    . DILATATION & CURETTAGE/HYSTEROSCOPY WITH MYOSURE N/A 08/16/2015   Procedure: DILATATION & CURETTAGE/HYSTEROSCOPY WITH MYOSURE;  Surgeon: Salvadore Dom, MD;  Location: [facility];  Service: Gynecology;  Laterality: N/A;  . TOTAL HIP ARTHROPLASTY Bilateral    anterior procedure  . WISDOM TOOTH EXTRACTION      Current Outpatient Medications  Medication Sig Dispense Refill  . CALCIUM PO Take by mouth daily.    . citalopram (CELEXA) 20 MG tablet Take 1 tablet (20 mg total) by mouth daily. 90 tablet 3  . cycloSPORINE (RESTASIS) 0.05 % ophthalmic emulsion Place 1 drop into both eyes 2 (two) times daily.    . Glucosamine-Chondroitin (COSAMIN DS) 500-400 MG CAPS TAKE 3 CAPSULES EVERY DAY    . latanoprost (XALATAN) 0.005 % ophthalmic  solution Place 1 drop into both eyes at bedtime.    Marland Kitchen losartan (COZAAR) 50 MG tablet Take 50 mg by mouth daily.    Marland Kitchen OVER THE COUNTER MEDICATION Take 2 capsules by mouth daily. Ultra Flora Balance Probiotic    . trimethoprim (TRIMPEX) 100 MG tablet Take 0.5 tablets (50 mg total) by mouth daily. 30 tablet 0   No current facility-administered medications for this visit.     Family History  Problem Relation Age of Onset  . Bladder Cancer Mother 12  . Lung cancer Father        smoker  . Ovarian cancer Cousin        paternal #1  . Colon cancer Cousin        paternal #2  . Breast cancer Cousin        maternal  . Cancer Maternal Grandmother        liver  . Lung cancer Maternal Grandfather        smoker  One P cousin had colon cancer (late 70's early 47's), one Pcousin had ovarian cancer (~50). Dad was one of 8 kids. She has ~15 Paternal cousins. One maternal cousin with breast cancer in her mid 92's.  Review of Systems  Constitutional: Negative.   HENT: Negative.   Eyes: Negative.   Respiratory: Negative.   Cardiovascular: Negative.   Gastrointestinal: Negative.   Endocrine: Negative.   Genitourinary: Negative.   Musculoskeletal: Negative.  Skin: Negative.   Allergic/Immunologic: Negative.   Neurological: Negative.   Hematological: Negative.   Psychiatric/Behavioral: Negative.     Exam:   BP 118/80 (BP Location: Right Arm, Patient Position: Sitting, Cuff Size: Normal)   Pulse 68   Temp (!) 97.1 F (36.2 C) (Skin)   Ht 5\' 4"  (1.626 m)   Wt 136 lb (61.7 kg)   LMP 05/24/2011   BMI 23.34 kg/m   Weight change: @WEIGHTCHANGE @ Height:   Height: 5\' 4"  (162.6 cm)  Ht Readings from Last 3 Encounters:  08/05/19 5\' 4"  (1.626 m)  06/14/19 5\' 5"  (1.651 m)  03/20/19 5\' 5"  (1.651 m)    General appearance: alert, cooperative and appears stated age Head: Normocephalic, without obvious abnormality, atraumatic Neck: no adenopathy, supple, symmetrical, trachea midline and thyroid  normal to inspection and palpation Lungs: clear to auscultation bilaterally Cardiovascular: regular rate and rhythm Breasts: normal appearance, no masses or tenderness Abdomen: soft, non-tender; non distended,  no masses,  no organomegaly Extremities: extremities normal, atraumatic, no cyanosis or edema Skin: Skin color, texture, turgor normal. No rashes or lesions Lymph nodes: Cervical, supraclavicular, and axillary nodes normal. No abnormal inguinal nodes palpated Neurologic: Grossly normal   Pelvic: External genitalia:  no lesions              Urethra:  normal appearing urethra with no masses, tenderness or lesions              Bartholins and Skenes: normal                 Vagina: atrophic appearing vagina with normal color and discharge, no lesions              Cervix: no lesions               Bimanual Exam:  Uterus:  normal size, contour, position, consistency, mobility, non-tender              Adnexa: no mass, fullness, tenderness               Rectovaginal: Confirms               Anus:  normal sphincter tone, no lesions  Chaperone was present for exam.  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

## 2019-08-05 ENCOUNTER — Ambulatory Visit: Payer: BC Managed Care – PPO | Admitting: Obstetrics and Gynecology

## 2019-08-05 ENCOUNTER — Encounter: Payer: Self-pay | Admitting: Obstetrics and Gynecology

## 2019-08-05 VITALS — BP 118/80 | HR 68 | Temp 97.1°F | Ht 64.0 in | Wt 136.0 lb

## 2019-08-05 DIAGNOSIS — Z800 Family history of malignant neoplasm of digestive organs: Secondary | ICD-10-CM | POA: Diagnosis not present

## 2019-08-05 DIAGNOSIS — Z8041 Family history of malignant neoplasm of ovary: Secondary | ICD-10-CM

## 2019-08-05 DIAGNOSIS — Z01419 Encounter for gynecological examination (general) (routine) without abnormal findings: Secondary | ICD-10-CM

## 2019-08-05 DIAGNOSIS — Z23 Encounter for immunization: Secondary | ICD-10-CM

## 2019-08-05 MED ORDER — TRIMETHOPRIM 100 MG PO TABS: 50.0000 mg | ORAL_TABLET | Freq: Every day | ORAL | 3 refills | Status: DC

## 2019-08-05 MED ORDER — CITALOPRAM HYDROBROMIDE 20 MG PO TABS: 20.0000 mg | ORAL_TABLET | Freq: Every day | ORAL | 3 refills | Status: DC

## 2019-08-05 NOTE — Patient Instructions (Signed)

## 2019-08-06 ENCOUNTER — Telehealth: Payer: Self-pay | Admitting: Genetic Counselor

## 2019-08-06 NOTE — Telephone Encounter (Signed)
A genetic counseling appt has been scheduled for Kathryn Odom to see Clint Guy on 28/3 at 9am.This appt is a video visit. Pt aware a link will be sent to her to login to her appt.

## 2019-08-10 ENCOUNTER — Telehealth: Payer: Self-pay | Admitting: Genetic Counselor

## 2019-08-10 NOTE — Telephone Encounter (Signed)
Called patient regarding upcoming virtual visit, patient is notified. 

## 2019-08-11 ENCOUNTER — Inpatient Hospital Stay: Payer: BC Managed Care – PPO | Attending: Genetic Counselor | Admitting: Genetic Counselor

## 2019-08-11 ENCOUNTER — Encounter: Payer: Self-pay | Admitting: Genetic Counselor

## 2019-08-11 DIAGNOSIS — Z801 Family history of malignant neoplasm of trachea, bronchus and lung: Secondary | ICD-10-CM | POA: Insufficient documentation

## 2019-08-11 DIAGNOSIS — Z8052 Family history of malignant neoplasm of bladder: Secondary | ICD-10-CM

## 2019-08-11 DIAGNOSIS — Z800 Family history of malignant neoplasm of digestive organs: Secondary | ICD-10-CM

## 2019-08-11 DIAGNOSIS — Z803 Family history of malignant neoplasm of breast: Secondary | ICD-10-CM | POA: Insufficient documentation

## 2019-08-11 DIAGNOSIS — Z8041 Family history of malignant neoplasm of ovary: Secondary | ICD-10-CM

## 2019-08-11 NOTE — Progress Notes (Signed)
REFERRING PROVIDER: Salvadore Dom, [address] [place],  [place] [address]  PRIMARY PROVIDER:  Martinique, Julie M, NP  PRIMARY REASON FOR VISIT:  1. Family history of breast cancer   2. Family history of colon cancer   3. Family history of ovarian cancer   4. Family history of lung cancer   5. Family history of bladder cancer   6. Family history of liver cancer      I connected with Kathryn Odom on 08/11/2019 at 9:00 am EDT by [facility] video conference and verified that I am speaking with the correct person using two identifiers.   Patient location: home Provider location: office  HISTORY OF PRESENT ILLNESS:   Kathryn Odom, a 58 y.o. female, was seen for a [facility] cancer genetics consultation at the request of Dr. Talbert Nan due to a family history of colon, ovarian, and breast cancer.  Kathryn Odom presents to clinic today to discuss the possibility of a hereditary predisposition to cancer, genetic testing, and to further clarify her future cancer risks, as well as potential cancer risks for family members.   Kathryn Odom is a 58 y.o. female with no personal history of cancer.    CANCER HISTORY:  Oncology History   No history exists.     RISK FACTORS:  Menarche was at age 45.  First live birth at age 36.  OCP use for approximately 28 years.  Ovaries intact: yes.  Hysterectomy: no.  Menopausal status: postmenopausal.  HRT use: 1 years. Colonoscopy: yes; normal at age 51, per patient. Mammogram within the last year: . Number of breast biopsies: 0. Any excessive radiation exposure in the past: no  Past Medical History:  Diagnosis Date   Arthritis    Dry eyes    Family history of bladder cancer    Family history of breast cancer    Family history of colon cancer    Family history of liver cancer    Family history of lung cancer    Family history of ovarian cancer    Hypertension    Microscopic hematuria 2012   w/u -    Past Surgical  History:  Procedure Laterality Date   COLONOSCOPY     DILATATION & CURETTAGE/HYSTEROSCOPY WITH MYOSURE N/A 08/16/2015   Procedure: [place];  Surgeon: Salvadore Dom, MD;  Location: [facility];  Service: Gynecology;  Laterality: N/A;   TOTAL HIP ARTHROPLASTY Bilateral    anterior procedure   WISDOM TOOTH EXTRACTION      Social History   Socioeconomic History   Marital status: Married    Spouse name: Not on file   Number of children: Not on file   Years of education: Not on file   Highest education level: Not on file  Occupational History   Not on file  Social Needs   Financial resource strain: Not on file   Food insecurity    Worry: Not on file    Inability: Not on file   Transportation needs    Medical: Not on file    Non-medical: Not on file  Tobacco Use   Smoking status: Never Smoker   Smokeless tobacco: Never Used  Substance and Sexual Activity   Alcohol use: Yes    Alcohol/week: 0.0 standard drinks    Comment: Occassionally   Drug use: No   Sexual activity: Yes    Partners: Male    Birth control/protection: Post-menopausal  Lifestyle   Physical activity  Days per week: Not on file    Minutes per session: Not on file   Stress: Not on file  Relationships   Social connections    Talks on phone: Not on file    Gets together: Not on file    Attends religious service: Not on file    Active member of club or organization: Not on file    Attends meetings of clubs or organizations: Not on file    Relationship status: Not on file  Other Topics Concern   Not on file  Social History Narrative   Not on file     FAMILY HISTORY:  We obtained a detailed, 4-generation family history.  Significant diagnoses are listed below: Family History  Problem Relation Age of Onset   Bladder Cancer Mother 89   Lung cancer Father 49       former smoker   Ovarian cancer Cousin 33       paternal #1   Colon  cancer Cousin 62       paternal #2   Breast cancer Cousin        maternal, dx. early 34s   Liver cancer Maternal Grandmother 87   Lung cancer Maternal Grandfather 74       smoker   Cancer Paternal Aunt        unknown type, dx. in her 22s   Cancer Paternal Uncle        unknown type, dx. in his 58s   Heart attack Paternal Grandmother    Stroke Paternal Grandfather    Aneurysm Cousin        4 paternal cousins, brain    Kathryn Odom has two daughters, ages 47 and 33, who have not had cancer. She does not have any siblings.  Kathryn Odom mother died at age 75, and had a history of bladder cancer diagnosed around age 91. She had one maternal uncle and three maternal aunts, all who died at ages older than 63 without cancer. One of her maternal cousins had breast cancer diagnosed in her early 63s and had a double mastectomy. Kathryn Odom maternal grandmother had liver cancer diagnosed at age 75 and her grandfather had lung cancer diagnosed at age 18. There are no other known diagnoses of cancer on the maternal side of the family.  Kathryn Odom father died at age 55 and had lung cancer diagnosed at age 54. He was a former smoker, but had not smoked for decades prior to his diagnosis. She had four paternal aunts and three paternal uncles. One of her aunts and one of her uncles had an unspecified type of cancer in their 59s. Kathryn Odom has one paternal cousin who was diagnosed with colon cancer at age 53, and another cousin who was diagnosed with ovarian cancer at age 50. Both of these individuals are deceased. Kathryn Odom also has four paternal cousins who have had brain aneurysms. Her paternal grandparents died in their 15s and 78s and did not have cancer. There are no other known diagnoses of cancer on the paternal side of the family.  Kathryn Odom is unaware of previous family history of genetic testing for hereditary cancer risks. Her maternal ancestors are of [place] descent, and  paternal ancestors are of English descent. There is no reported Ashkenazi Jewish ancestry. There is no known consanguinity.  GENETIC COUNSELING ASSESSMENT: Kathryn Odom is a 58 y.o. female with a family history of young-onset ovarian, colon, and breast cancers, which is somewhat suggestive of a hereditary  cancer syndrome and predisposition to cancer. We, therefore, discussed and recommended the following at today's visit.   DISCUSSION: We discussed that approximately 5-10% of cancer is hereditary.  Most cases of hereditary breast and ovarian cancer are associated with the BRCA genes.  There are other cancers seen in her family that can be associated with hereditary syndromes, such as the risk for colon cancer in Lynch syndrome.  We discussed that testing and identifying a hereditary cancer syndrome is beneficial for several reasons, including knowing about other cancer risks, identifying potential screening and risk-reduction options that may be appropriate, and to understand if other family members could be at risk for cancer and allow them to undergo genetic testing.   We reviewed the characteristics, features and inheritance patterns of hereditary cancer syndromes. We also discussed genetic testing, including the appropriate family members to test, the process of testing, insurance coverage and turn-around-time for results. We discussed the implications of a negative, positive and/or variant of uncertain significant result. We recommended Kathryn Odom pursue genetic testing for the [facility] Multi-Cancer panel.   The Multi-Cancer Panel offered by [facility] includes sequencing and/or deletion duplication testing of the following 85 genes: AIP, ALK, APC, ATM, AXIN2,BAP1,  BARD1, BLM, BMPR1A, BRCA1, BRCA2, BRIP1, CASR, CDC73, CDH1, CDK4, CDKN1B, CDKN1C, CDKN2A (p14ARF), CDKN2A (p16INK4a), CEBPA, CHEK2, CTNNA1, DICER1, DIS3L2, EGFR (c.2369C>T, p.Thr790Met variant only), EPCAM (Deletion/duplication testing only),  FH, FLCN, GATA2, GPC3, GREM1 (Promoter region deletion/duplication testing only), HOXB13 (c.251G>A, p.Gly84Glu), HRAS, KIT, MAX, MEN1, MET, MITF (c.952G>A, p.Glu318Lys variant only), MLH1, MSH2, MSH3, MSH6, MUTYH, NBN, NF1, NF2, NTHL1, PALB2, PDGFRA, PHOX2B, PMS2, POLD1, POLE, POT1, PRKAR1A, PTCH1, PTEN, RAD50, RAD51C, RAD51D, RB1, RECQL4, RET, RNF43, RUNX1, SDHAF2, SDHA (sequence changes only), SDHB, SDHC, SDHD, SMAD4, SMARCA4, SMARCB1, SMARCE1, STK11, SUFU, TERC, TERT, TMEM127, TP53, TSC1, TSC2, VHL, WRN and WT1.    Based on Kathryn Odom family history of cancer, she meets medical criteria for genetic testing. Despite that she meets criteria, she may still have an out of pocket cost.  We discussed that if her out of pocket cost for testing is over $100, the laboratory would call and confirm whether she wants to proceed with testing.  If the out of pocket cost of testing is less than $100, she would be billed by the genetic testing laboratory. To better understand what her out of pocket cost might be, we discussed the option to request that the genetic testing laboratory investigate insurance coverage.  We discussed that some people do not want to undergo genetic testing due to fear of genetic discrimination.  A federal law called the Genetic Information Non-Discrimination Act (GINA) of 2008 helps protect individuals against genetic discrimination based on their genetic test results.  It impacts both health insurance and employment.  With health insurance, it protects against increased premiums, being kicked off insurance or being forced to take a test in order to be insured.  For employment it protects against hiring, firing and promoting decisions based on genetic test results.  Health status due to a cancer diagnosis is not protected under GINA.  Additionally, life, disability, and long-term care insurance is not protected under GINA.   PLAN: After considering the risks, benefits, and limitations, Ms.  Odom provided informed consent to pursue genetic testing, conditional on her comfort and approval of the estimated out of pocket cost.  To better estimate her out of pocket cost, we will request that the genetic testing laboratory, [facility], perform a billing investigation.  If Kathryn Odom wishes to  proceed with genetic testing at that time, we will make a plan to obtain her sample, either via salvia kit or blood.    Once the laboratory receives her sample, results should be available within approximately two-three weeks' time.  At that point they will be disclosed by telephone to Kathryn Odom, as will any additional recommendations warranted by these results. Kathryn Odom will receive a summary of her genetic counseling visit and a copy of her results once available. This information will also be available in Epic.   Kathryn Odom questions were answered to her satisfaction today. Our contact information was provided should additional questions or concerns arise. Thank you for the referral and allowing [place] to share in the care of your patient.   Clint Guy, MS, [facility] Certified Genetic Counselor [email or web address] Phone: [phone]  The patient was seen for a total of 50 minutes in face-to-face genetic counseling.  This patient was discussed with Drs. Magrinat, Lindi Adie and/or Burr Medico who agrees with the above.    _______________________________________________________________________ For Office Staff:  Number of people involved in session: 1 Was an Intern/ student involved with case: no

## 2019-08-31 ENCOUNTER — Telehealth: Payer: Self-pay | Admitting: Genetic Counselor

## 2019-08-31 NOTE — Telephone Encounter (Signed)
Discussed with Ms. Hiraldo that the estimated out of pocket cost for genetic testing is [phone] if billed through insurance. There is also a $250 self pay option, which would be billed directly from the genetic testing laboratory, not through insurance. Ms. Truax felt that this is more than she is willing to pay at this time. We understand her decision and welcome her to reach out at any point in the future if she decides that she is interested in pursuing genetic testing.

## 2019-11-20 ENCOUNTER — Other Ambulatory Visit: Payer: Self-pay | Admitting: Obstetrics and Gynecology

## 2019-11-20 DIAGNOSIS — Z1231 Encounter for screening mammogram for malignant neoplasm of breast: Secondary | ICD-10-CM

## 2019-12-17 ENCOUNTER — Other Ambulatory Visit: Payer: Self-pay

## 2019-12-17 ENCOUNTER — Ambulatory Visit
Admission: RE | Admit: 2019-12-17 | Discharge: 2019-12-17 | Disposition: A | Discharge status: Home / Self Care | Payer: BC Managed Care – PPO | Source: Ambulatory Visit | Attending: Obstetrics and Gynecology | Admitting: Obstetrics and Gynecology

## 2019-12-17 DIAGNOSIS — Z1231 Encounter for screening mammogram for malignant neoplasm of breast: Secondary | ICD-10-CM

## 2019-12-18 ENCOUNTER — Other Ambulatory Visit: Payer: Self-pay | Admitting: Obstetrics and Gynecology

## 2019-12-18 DIAGNOSIS — R928 Other abnormal and inconclusive findings on diagnostic imaging of breast: Secondary | ICD-10-CM

## 2019-12-28 ENCOUNTER — Other Ambulatory Visit: Payer: Self-pay

## 2019-12-28 ENCOUNTER — Other Ambulatory Visit: Payer: Self-pay | Admitting: Obstetrics and Gynecology

## 2019-12-28 ENCOUNTER — Ambulatory Visit
Admission: RE | Admit: 2019-12-28 | Discharge: 2019-12-28 | Disposition: A | Discharge status: Home / Self Care | Payer: BC Managed Care – PPO | Source: Ambulatory Visit | Attending: Obstetrics and Gynecology | Admitting: Obstetrics and Gynecology

## 2019-12-28 DIAGNOSIS — R928 Other abnormal and inconclusive findings on diagnostic imaging of breast: Secondary | ICD-10-CM

## 2020-06-25 ENCOUNTER — Other Ambulatory Visit: Payer: Self-pay | Admitting: Obstetrics and Gynecology

## 2020-06-25 DIAGNOSIS — Z01419 Encounter for gynecological examination (general) (routine) without abnormal findings: Secondary | ICD-10-CM

## 2020-06-27 NOTE — Telephone Encounter (Signed)
Medication refill request: Citalopram 20mg   Last AEX:  08/05/19 Next AEX: 08/10/20 Last MMG (if hormonal medication request): 12/17/19  Neg  Refill authorized: 90/0

## 2020-07-12 ENCOUNTER — Encounter: Payer: Self-pay | Admitting: Obstetrics & Gynecology

## 2020-08-08 NOTE — Progress Notes (Signed)
59 y.o. G30P2002 Married White or Caucasian Not Hispanic or Latino female here for annual exam.  No vaginal bleeding.   She saw a Dentist last year for her FH of multiple cancers. Genetic testing wasn't done secondary to expense.     She is on daily antibiotics for UTI prevention. Only one UTI in the last year. Prior to starting the suppression she was having 3-4 UTI's a year, long term. Not related to intercourse.  Sexually active, no pain.   Patient's last menstrual period was 05/24/2011.          Sexually active: Yes.    The current method of family planning is post menopausal status.    Exercising: Yes.    Walk Smoker:  no  Health Maintenance: Pap:  06/27/2017 WNL,06-20-16 WNL NEG HR HPV History of abnormal Pap:  Yes repeat pap was normal  MMG:  12/17/19 density B Bi-rads incomplete 12/28/19 us/ left breast.  Bi-rads 1 neg  BMD: 10/14/2018 WNL Colonoscopy: Fall 2021, small polyp. Needs f/u in 5 years.  TDaP:  08/05/19 Gardasil: NA   reports that she has never smoked. She has never used smokeless tobacco. She reports current alcohol use. She reports that she does not use drugs. Rare ETOH. Director ofprogram management. 2 adult daughters (30 and 22).  Past Medical History:  Diagnosis Date  . Arthritis   . Dry eyes   . Family history of bladder cancer   . Family history of breast cancer   . Family history of colon cancer   . Family history of liver cancer   . Family history of lung cancer   . Family history of ovarian cancer   . Hypertension   . Microscopic hematuria 2012   w/u -    Past Surgical History:  Procedure Laterality Date  . COLONOSCOPY    . DILATATION & CURETTAGE/HYSTEROSCOPY WITH MYOSURE N/A 08/16/2015   Procedure: DILATATION & CURETTAGE/HYSTEROSCOPY WITH MYOSURE;  Surgeon: Romualdo Bolk, MD;  Location: [facility];  Service: Gynecology;  Laterality: N/A;  . TOTAL HIP ARTHROPLASTY Bilateral    anterior procedure  . WISDOM TOOTH EXTRACTION       Current Outpatient Medications  Medication Sig Dispense Refill  . CALCIUM PO Take by mouth daily.    . citalopram (CELEXA) 20 MG tablet Take 1 tablet (20 mg total) by mouth daily. 90 tablet 0  . cycloSPORINE (RESTASIS) 0.05 % ophthalmic emulsion Place 1 drop into both eyes 2 (two) times daily.    . Glucosamine-Chondroitin (COSAMIN DS) 500-400 MG CAPS TAKE 3 CAPSULES EVERY DAY    . latanoprost (XALATAN) 0.005 % ophthalmic solution Place 1 drop into both eyes at bedtime.    Marland Kitchen losartan (COZAAR) 50 MG tablet Take 50 mg by mouth daily.    Marland Kitchen OVER THE COUNTER MEDICATION Take 2 capsules by mouth daily. Ultra Flora Balance Probiotic    . trimethoprim (TRIMPEX) 100 MG tablet Take 0.5 tablets (50 mg total) by mouth daily. 90 tablet 3   No current facility-administered medications for this visit.    Family History  Problem Relation Age of Onset  . Bladder Cancer Mother 14  . Lung cancer Father 31       former smoker  . Ovarian cancer Cousin 35       paternal #1  . Colon cancer Cousin 40       paternal #2  . Breast cancer Cousin        maternal, dx. early 50s  . Liver  cancer Maternal Grandmother 74  . Lung cancer Maternal Grandfather 65       smoker  . Cancer Paternal Aunt        unknown type, dx. in her 26s  . Cancer Paternal Uncle        unknown type, dx. in his 28s  . Heart attack Paternal Grandmother   . Stroke Paternal Grandfather   . Aneurysm Cousin        4 paternal cousins, brain  One P cousin had colon cancer (late 44's early 26's), one Pcousin had ovarian cancer (~50). Dad was one of 8 kids. She has ~15 Paternal cousins. One maternal cousin with breast cancer in her mid 61's  Review of Systems  All other systems reviewed and are negative.   Exam:   BP 132/74   Pulse 74   Ht 5\' 4"  (1.626 m)   Wt 147 lb (66.7 kg)   LMP 05/24/2011   SpO2 96%   BMI 25.23 kg/m   Weight change: @WEIGHTCHANGE @ Height:   Height: 5\' 4"  (162.6 cm)  Ht Readings from Last 3 Encounters:   08/10/20 5\' 4"  (1.626 m)  08/05/19 5\' 4"  (1.626 m)  06/14/19 5\' 5"  (1.651 m)    General appearance: alert, cooperative and appears stated age Head: Normocephalic, without obvious abnormality, atraumatic Neck: no adenopathy, supple, symmetrical, trachea midline and thyroid normal to inspection and palpation Lungs: clear to auscultation bilaterally Cardiovascular: regular rate and rhythm Breasts: normal appearance, no masses or tenderness Abdomen: soft, non-tender; non distended,  no masses,  no organomegaly Extremities: extremities normal, atraumatic, no cyanosis or edema Skin: Skin color, texture, turgor normal. No rashes or lesions Lymph nodes: Cervical, supraclavicular, and axillary nodes normal. No abnormal inguinal nodes palpated Neurologic: Grossly normal   Pelvic: External genitalia:  no lesions              Urethra:  normal appearing urethra with no masses, tenderness or lesions              Bartholins and Skenes: normal                 Vagina: mildly atrophic appearing vagina with normal color and discharge, no lesions              Cervix: no lesions               Bimanual Exam:  Uterus:  normal size, contour, position, consistency, mobility, non-tender              Adnexa: no mass, fullness, tenderness               Rectovaginal: Confirms               Anus:  normal sphincter tone, no lesions  14/08/21 chaperoned for the exam.  A:  Well Woman with normal exam  FH of multiple cancers, has seen genetics, testing not done secondary to expense  H/O recurrent UTI's, on prophylaxis  On Celexa for depression and anxiety, doing well.   P:   Pap next year  Mammogram in 4/22  Colonoscopy UTD  Discussed breast self exam  Discussed calcium and vit D intake  Continue Celexa  Labs with primary  Will continue the bactirm for the next year then consider stopping. Can potentially be used for up to 5 years

## 2020-08-10 ENCOUNTER — Ambulatory Visit: Payer: BC Managed Care – PPO | Admitting: Obstetrics and Gynecology

## 2020-08-10 ENCOUNTER — Other Ambulatory Visit: Payer: Self-pay

## 2020-08-10 ENCOUNTER — Encounter: Payer: Self-pay | Admitting: Obstetrics and Gynecology

## 2020-08-10 VITALS — BP 132/74 | HR 74 | Ht 64.0 in | Wt 147.0 lb

## 2020-08-10 DIAGNOSIS — Z803 Family history of malignant neoplasm of breast: Secondary | ICD-10-CM | POA: Diagnosis not present

## 2020-08-10 DIAGNOSIS — Z8041 Family history of malignant neoplasm of ovary: Secondary | ICD-10-CM | POA: Diagnosis not present

## 2020-08-10 DIAGNOSIS — Z800 Family history of malignant neoplasm of digestive organs: Secondary | ICD-10-CM

## 2020-08-10 DIAGNOSIS — Z8659 Personal history of other mental and behavioral disorders: Secondary | ICD-10-CM

## 2020-08-10 DIAGNOSIS — Z01419 Encounter for gynecological examination (general) (routine) without abnormal findings: Secondary | ICD-10-CM | POA: Diagnosis not present

## 2020-08-10 DIAGNOSIS — Z87440 Personal history of urinary (tract) infections: Secondary | ICD-10-CM

## 2020-08-10 MED ORDER — CITALOPRAM HYDROBROMIDE 20 MG PO TABS: 20.0000 mg | ORAL_TABLET | Freq: Every day | ORAL | 3 refills | Status: DC

## 2020-08-10 MED ORDER — TRIMETHOPRIM 100 MG PO TABS: 50.0000 mg | ORAL_TABLET | Freq: Every day | ORAL | 3 refills | Status: DC

## 2020-08-10 NOTE — Patient Instructions (Signed)

## 2020-08-14 ENCOUNTER — Telehealth: Payer: Self-pay | Admitting: Obstetrics and Gynecology

## 2020-08-14 NOTE — Telephone Encounter (Signed)
mychart message sent

## 2020-08-14 NOTE — Telephone Encounter (Signed)
-----   Message from Isabella Bowens sent at 08/10/2020  8:54 AM EST ----- Hi Dr. Oscar La,  Yes happy to help! Her Tyrer-Cuzick lifetime breast cancer risk is 6.8% (based on family history information that we took last year). No need for increased breast screening based on this info. Let me know if there is anything else I can help with.  Thanks, Irving Burton ----- Message ----- From: Romualdo Bolk, MD Sent: 08/10/2020   8:41 AM EST To: Eather Colas, You saw Ms Vanduyn last year and recommend genetic testing for her. She didn't do the testing secondary to cost.  Can you please do a Tyrer Cuzik risk assessment on her and make any recommendations about increased screening recommendations for her? Thanks, Noreene Larsson

## 2020-12-05 ENCOUNTER — Other Ambulatory Visit: Payer: Self-pay | Admitting: Obstetrics and Gynecology

## 2020-12-05 DIAGNOSIS — Z1231 Encounter for screening mammogram for malignant neoplasm of breast: Secondary | ICD-10-CM

## 2021-01-18 ENCOUNTER — Ambulatory Visit
Admission: RE | Admit: 2021-01-18 | Discharge: 2021-01-18 | Disposition: A | Discharge status: Home / Self Care | Payer: BC Managed Care – PPO | Source: Ambulatory Visit

## 2021-01-18 ENCOUNTER — Other Ambulatory Visit: Payer: Self-pay

## 2021-01-18 DIAGNOSIS — Z1231 Encounter for screening mammogram for malignant neoplasm of breast: Secondary | ICD-10-CM

## 2021-08-11 ENCOUNTER — Other Ambulatory Visit: Payer: Self-pay | Admitting: Obstetrics and Gynecology

## 2021-08-11 NOTE — Telephone Encounter (Signed)
Annual exam scheduled on 08/17/21

## 2021-08-15 NOTE — Progress Notes (Signed)
60 y.o. G22P2002 Married White or Caucasian Not Hispanic or Latino female here for annual exam.  No vaginal bleeding. No dyspareunia.    She saw a Dietitian a few years ago for her FH of multiple cancers. Genetic testing wasn't done secondary to expense. TC risk of breast cancer was 6.8%.    She has been on daily antibiotics for UTI prevention for ~2.5 years. No UTI's for almost 2 years. Prior to starting the suppression she was having 3-4 UTI's a year, long term (even prior to menopause). Not related to intercourse. She has seen Urology in the past, negative evaluation.   On Celexa for depression and anxiety, working well.   Patient's last menstrual period was 05/24/2011.          Sexually active: Yes.    The current method of family planning is post menopausal status.    Exercising: No.  The patient does not participate in regular exercise at present. Smoker:  no  Health Maintenance: Pap:  06/27/2017 WNL, 06-20-16 WNL NEG HR HPV  History of abnormal Pap:  Yes repeat pap was normal  MMG:  01/18/21 density B Bi-rads 1 neg  BMD:   10/14/2018 WNL Colonoscopy: 2021 small polyp f/u 5 years  TDaP:  08/05/19  Gardasil: n/a   reports that she has never smoked. She has never used smokeless tobacco. She reports current alcohol use. She reports that she does not use drugs. 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 She is looking for another job, wants to work until she is 26.  2 adult daughters (62 and 66). Neither is married, both doing well.   Past Medical History:  Diagnosis Date   Arthritis    Dry eyes    Family history of bladder cancer    Family history of breast cancer    Family history of colon cancer    Family history of liver cancer    Family history of lung cancer    Family history of ovarian cancer    Hypertension    Microscopic hematuria 2012   w/u -    Past Surgical History:  Procedure Laterality Date    COLONOSCOPY     DILATATION & CURETTAGE/HYSTEROSCOPY WITH MYOSURE N/A 08/16/2015   Procedure: DILATATION & CURETTAGE/HYSTEROSCOPY WITH MYOSURE;  Surgeon: Salvadore Dom, MD;  Location: [facility];  Service: Gynecology;  Laterality: N/A;   TOTAL HIP ARTHROPLASTY Bilateral    anterior procedure   WISDOM TOOTH EXTRACTION      Current Outpatient Medications  Medication Sig Dispense Refill   atorvastatin (LIPITOR) 20 MG tablet Take 20 mg by mouth daily.     CALCIUM PO Take by mouth daily.     citalopram (CELEXA) 20 MG tablet Take 1 tablet (20 mg total) by mouth daily. 90 tablet 3   cycloSPORINE (RESTASIS) 0.05 % ophthalmic emulsion Place 1 drop into both eyes 2 (two) times daily.     Glucosamine-Chondroitin 500-400 MG CAPS TAKE 3 CAPSULES EVERY DAY     latanoprost (XALATAN) 0.005 % ophthalmic solution Place 1 drop into both eyes at bedtime.     losartan (COZAAR) 50 MG tablet Take 50 mg by mouth daily.     OVER THE COUNTER MEDICATION Take 2 capsules by mouth daily. Ultra Flora Balance Probiotic     trimethoprim (TRIMPEX) 100 MG tablet Take 0.5 tablets (50 mg total) by mouth daily. 45 tablet 0   No current facility-administered medications for this  visit.    Family History  Problem Relation Age of Onset   Bladder Cancer Mother 52   Lung cancer Father 59       former smoker   Ovarian cancer Cousin 29       paternal #1   Colon cancer Cousin 63       paternal #2   Breast cancer Cousin        maternal, dx. early 36s   Liver cancer Maternal Grandmother 87   Lung cancer Maternal Grandfather 53       smoker   Cancer Paternal Aunt        unknown type, dx. in her 10s   Cancer Paternal Uncle        unknown type, dx. in his 68s   Heart attack Paternal Grandmother    Stroke Paternal Grandfather    Aneurysm Cousin        4 paternal cousins, brain    Review of Systems  All other systems reviewed and are negative.  Exam:   BP 128/70   Pulse 75   Ht 5\' 5"  (1.651 m)   Wt 169 lb (76.7  kg)   LMP 05/24/2011   SpO2 100%   BMI 28.12 kg/m   Weight change: @WEIGHTCHANGE @ Height:   Height: 5\' 5"  (165.1 cm)  Ht Readings from Last 3 Encounters:  08/17/21 5\' 5"  (1.651 m)  08/10/20 5\' 4"  (1.626 m)  08/05/19 5\' 4"  (1.626 m)    General appearance: alert, cooperative and appears stated age Head: Normocephalic, without obvious abnormality, atraumatic Neck: no adenopathy, supple, symmetrical, trachea midline and thyroid normal to inspection and palpation Lungs: clear to auscultation bilaterally Cardiovascular: regular rate and rhythm Breasts: normal appearance, no masses or tenderness Abdomen: soft, non-tender; non distended,  no masses,  no organomegaly Extremities: extremities normal, atraumatic, no cyanosis or edema Skin: Skin color, texture, turgor normal. No rashes or lesions Lymph nodes: Cervical, supraclavicular, and axillary nodes normal. No abnormal inguinal nodes palpated Neurologic: Grossly normal   Pelvic: External genitalia:  no lesions              Urethra:  normal appearing urethra with no masses, tenderness or lesions              Bartholins and Skenes: normal                 Vagina: mildly atrophic appearing vagina with normal color and discharge, no lesions              Cervix: no lesions               Bimanual Exam:  Uterus:  normal size, contour, position, consistency, mobility, non-tender              Adnexa: no mass, fullness, tenderness               Rectovaginal: Confirms               Anus:  normal sphincter tone, no lesions  Gae Dry chaperoned for the exam.  1. Well woman exam Discussed breast self exam Discussed calcium and vit D intake Pap next year Mammogram in 5/23 Colonoscopy is UTD  2. History of depression Well controlled - citalopram (CELEXA) 20 MG tablet; Take 1 tablet (20 mg total) by mouth daily.  Dispense: 90 tablet; Refill: 4  3. History of anxiety Well controlled - citalopram (CELEXA) 20 MG tablet; Take 1 tablet (20 mg  total) by mouth daily.  Dispense: 90 tablet; Refill: 4  4. History of recurrent UTI (urinary tract infection) Well controlled. We discussed trying to change antibiotics to 3 days a week (would need to change antibiotics). She would prefer not to change now.  - trimethoprim (TRIMPEX) 100 MG tablet; Take 0.5 tablets (50 mg total) by mouth daily.  Dispense: 45 tablet; Refill: 4  5. Family history of ovarian cancer  6. Family history of colon cancer  7. Family history of breast cancer Has seen a Dentist.

## 2021-08-17 ENCOUNTER — Encounter: Payer: Self-pay | Admitting: Obstetrics and Gynecology

## 2021-08-17 ENCOUNTER — Other Ambulatory Visit: Payer: Self-pay

## 2021-08-17 ENCOUNTER — Ambulatory Visit (INDEPENDENT_AMBULATORY_CARE_PROVIDER_SITE_OTHER): Payer: BC Managed Care – PPO | Admitting: Obstetrics and Gynecology

## 2021-08-17 VITALS — BP 128/70 | HR 75 | Ht 65.0 in | Wt 169.0 lb

## 2021-08-17 DIAGNOSIS — Z8041 Family history of malignant neoplasm of ovary: Secondary | ICD-10-CM

## 2021-08-17 DIAGNOSIS — Z87440 Personal history of urinary (tract) infections: Secondary | ICD-10-CM

## 2021-08-17 DIAGNOSIS — Z01419 Encounter for gynecological examination (general) (routine) without abnormal findings: Secondary | ICD-10-CM | POA: Diagnosis not present

## 2021-08-17 DIAGNOSIS — Z800 Family history of malignant neoplasm of digestive organs: Secondary | ICD-10-CM

## 2021-08-17 DIAGNOSIS — Z803 Family history of malignant neoplasm of breast: Secondary | ICD-10-CM | POA: Diagnosis not present

## 2021-08-17 DIAGNOSIS — Z8659 Personal history of other mental and behavioral disorders: Secondary | ICD-10-CM | POA: Diagnosis not present

## 2021-08-17 MED ORDER — TRIMETHOPRIM 100 MG PO TABS
50.0000 mg | ORAL_TABLET | Freq: Every day | ORAL | 4 refills | Status: DC
Start: 2021-08-17 — End: 2023-09-19

## 2021-08-17 MED ORDER — CITALOPRAM HYDROBROMIDE 20 MG PO TABS
20.0000 mg | ORAL_TABLET | Freq: Every day | ORAL | 4 refills | Status: DC
Start: 2021-08-17 — End: 2022-08-23

## 2021-08-17 NOTE — Patient Instructions (Signed)

## 2021-09-29 ENCOUNTER — Emergency Department
Admission: RE | Admit: 2021-09-29 | Discharge: 2021-09-29 | Disposition: A | Discharge status: Home / Self Care | Payer: BC Managed Care – PPO | Source: Ambulatory Visit

## 2021-09-29 ENCOUNTER — Other Ambulatory Visit: Payer: Self-pay

## 2021-09-29 VITALS — BP 120/72 | HR 108 | Temp 98.7°F | Resp 16 | Ht 65.0 in | Wt 160.0 lb

## 2021-09-29 DIAGNOSIS — R300 Dysuria: Secondary | ICD-10-CM

## 2021-09-29 DIAGNOSIS — N3001 Acute cystitis with hematuria: Secondary | ICD-10-CM

## 2021-09-29 LAB — POCT URINALYSIS DIP (MANUAL ENTRY)
Bilirubin, UA: NEGATIVE
Glucose, UA: NEGATIVE mg/dL
Ketones, POC UA: NEGATIVE mg/dL
Nitrite, UA: NEGATIVE
Protein Ur, POC: 30 mg/dL — AB
Spec Grav, UA: >=1.03 — AB (ref 1.010–1.025)
Urobilinogen, UA: 0.2 E.U./dL
pH, UA: 6 (ref 5.0–8.0)

## 2021-09-29 MED ORDER — NITROFURANTOIN MONOHYD MACRO 100 MG PO CAPS: 100.0000 mg | ORAL_CAPSULE | Freq: Two times a day (BID) | ORAL | 0 refills | Status: AC

## 2021-09-29 MED ORDER — PHENAZOPYRIDINE HCL 200 MG PO TABS: 200.0000 mg | ORAL_TABLET | Freq: Three times a day (TID) | ORAL | 0 refills | Status: AC

## 2021-09-29 NOTE — ED Triage Notes (Signed)
Burning & urgency for a few days  Hx of chronic UTI's  None in 2 years

## 2021-09-29 NOTE — ED Provider Notes (Signed)
[facility]    CSN: [id number] Arrival date & time: 09/29/21  1244      History   Chief Complaint Chief Complaint  Patient presents with   Dysuria    HPI Kath[facility] is a 61 y.o. female.   HPI 61 year old female presents with dysuria for 2 days.  PMH significant for HTN and microscopic hematuria and history of chronic UTIs although reports none in the past 2 years..  Past Medical History:  Diagnosis Date   Arthritis    Dry eyes    Family history of bladder cancer    Family history of breast cancer    Family history of colon cancer    Family history of liver cancer    Family history of lung cancer    Family history of ovarian cancer    Hypertension    Microscopic hematuria 2012   w/u -    Patient Active Problem List   Diagnosis Date Noted   Family history of breast cancer    Family history of colon cancer    Family history of ovarian cancer    Family history of lung cancer    Family history of bladder cancer    Family history of liver cancer    Status post bilateral hip replacements 06/21/2018   History of depression 06/27/2017   Other and unspecified hyperlipidemia 05/27/2013   Need for prophylactic hormone replacement therapy (postmenopausal) 05/27/2013   Unspecified essential hypertension 05/15/2013   Benign microscopic hematuria 05/15/2013    Past Surgical History:  Procedure Laterality Date   COLONOSCOPY     DILATATION & CURETTAGE/HYSTEROSCOPY WITH MYOSURE N/A 08/16/2015   Procedure: DILATATION & CURETTAGE/HYSTEROSCOPY WITH MYOSURE;  Surgeon: Salvadore Dom, MD;  Location: [facility];  Service: Gynecology;  Laterality: N/A;   TOTAL HIP ARTHROPLASTY Bilateral    anterior procedure   WISDOM TOOTH EXTRACTION      OB History     Gravida  2   Para  2   Term  2   Preterm      AB      Living  2      SAB      IAB      Ectopic      Multiple      Live Births  2            Home Medications    Prior to Admission  medications   Medication Sig Start Date End Date Taking? Authorizing Provider  nitrofurantoin, macrocrystal-monohydrate, (MACROBID) 100 MG capsule Take 1 capsule (100 mg total) by mouth 2 (two) times daily for 7 days. 09/29/21 10/06/21 Yes Eliezer Lofts, FNP  phenazopyridine (PYRIDIUM) 200 MG tablet Take 1 tablet (200 mg total) by mouth 3 (three) times daily for 5 days. 09/29/21 10/04/21 Yes Eliezer Lofts, FNP  atorvastatin (LIPITOR) 20 MG tablet Take 20 mg by mouth daily. 07/13/21   [provider]  CALCIUM PO Take by mouth daily. Patient not taking: Reported on 09/29/2021    [provider]  citalopram (CELEXA) 20 MG tablet Take 1 tablet (20 mg total) by mouth daily. 08/17/21   Salvadore Dom, MD  cycloSPORINE (RESTASIS) 0.05 % ophthalmic emulsion Place 1 drop into both eyes 2 (two) times daily.    [provider]  Glucosamine-Chondroitin 500-400 MG CAPS TAKE 3 CAPSULES EVERY DAY 03/12/17   [provider]  latanoprost (XALATAN) 0.005 % ophthalmic solution Place 1 drop into both eyes at bedtime. 06/29/19   [provider]  losartan (COZAAR) 50 MG tablet Take 50 mg by mouth daily.    [provider]  OVER THE COUNTER MEDICATION Take 2 capsules by mouth daily. Ultra Flora Balance Probiotic    [provider]  trimethoprim (TRIMPEX) 100 MG tablet Take 0.5 tablets (50 mg total) by mouth daily. 08/17/21   Salvadore Dom, MD    Family History Family History  Problem Relation Age of Onset   Bladder Cancer Mother 22   Lung cancer Father 66       former smoker   Ovarian cancer Cousin 81       paternal #1   Colon cancer Cousin 58       paternal #2   Breast cancer Cousin        maternal, dx. early 5s   Liver cancer Maternal Grandmother 87   Lung cancer Maternal Grandfather 85       smoker   Cancer Paternal Aunt        unknown type, dx. in her 70s   Cancer Paternal Uncle        unknown type, dx. in his 9s   Heart attack  Paternal Grandmother    Stroke Paternal Grandfather    Aneurysm Cousin        4 paternal cousins, brain    Social History Social History   Tobacco Use   Smoking status: Never   Smokeless tobacco: Never  Vaping Use   Vaping Use: Never used  Substance Use Topics   Alcohol use: Yes    Alcohol/week: 0.0 standard drinks    Comment: Occassionally   Drug use: No     Allergies   Bactrim [sulfamethoxazole-trimethoprim]   Review of Systems Review of Systems  Genitourinary:  Positive for dysuria.    Physical Exam Triage Vital Signs ED Triage Vitals  Enc Vitals Group     BP      Pulse      Resp      Temp      Temp src      SpO2      Weight      Height      Head Circumference      Peak Flow      Pain Score      Pain Loc      Pain Edu?      Excl. in [place]?    No data found.  Updated Vital Signs BP 120/72 (BP Location: Left Arm)    Pulse (!) 108    Temp 98.7 F (37.1 C) (Oral)    Resp 16    Ht 5\' 5"  (1.651 m)    Wt 160 lb (72.6 kg)    LMP 05/24/2011    SpO2 98%    BMI 26.63 kg/m    Physical Exam Vitals and nursing note reviewed.  Constitutional:      General: She is not in acute distress.    Appearance: Normal appearance. She is not ill-appearing.  HENT:     Mouth/Throat:     Mouth: Mucous membranes are moist.     Pharynx: Oropharynx is clear.  Eyes:     Extraocular Movements: Extraocular movements intact.     Conjunctiva/sclera: Conjunctivae normal.     Pupils: Pupils are equal, round, and reactive to light.  Cardiovascular:     Rate and Rhythm: Normal rate and regular rhythm.     Pulses: Normal pulses.     Heart sounds: Normal heart sounds.  Pulmonary:  Effort: Pulmonary effort is normal.     Breath sounds: Normal breath sounds.  Abdominal:     Tenderness: There is no right CVA tenderness or left CVA tenderness.  Musculoskeletal:     Cervical back: Normal range of motion and neck supple.  Skin:    General: Skin is warm and dry.  Neurological:      General: No focal deficit present.     Mental Status: She is alert and oriented to person, place, and time.     UC Treatments / Results  Labs (all labs ordered are listed, but only abnormal results are displayed) Labs Reviewed  POCT URINALYSIS DIP (MANUAL ENTRY) - Abnormal; Notable for the following components:      Result Value   Clarity, UA turbid (*)    Spec Grav, UA >=1.030 (*)    Blood, UA moderate (*)    Protein Ur, POC =30 (*)    Leukocytes, UA Large (3+) (*)    All other components within normal limits  URINE CULTURE    EKG   Radiology No results found.  Procedures Procedures (including critical care time)  Medications Ordered in UC Medications - No data to display  Initial Impression / Assessment and Plan / UC Course  I have reviewed the triage vital signs and the nursing notes.  Pertinent labs & imaging results that were available during my care of the patient were reviewed by me and considered in my medical decision making (see chart for details).     MDM: 1.  Acute cystitis with hematuria-Rx'd Macrobid and Pyridium. Advised patient to take medications as directed with food to completion.  Encouraged patient to increase daily water intake while taking these medications.  Advised we will follow-up with you once urine culture results return.  Patient discharged home, hemodynamically stable. Final Clinical Impressions(s) / UC Diagnoses   Final diagnoses:  Dysuria  Acute cystitis with hematuria     Discharge Instructions      Advised patient to take medications as directed with food to completion.  Encouraged patient to increase daily water intake while taking these medications.  Advised we will follow-up with you once urine culture results return.     ED Prescriptions     Medication Sig Dispense Auth. Provider   nitrofurantoin, macrocrystal-monohydrate, (MACROBID) 100 MG capsule Take 1 capsule (100 mg total) by mouth 2 (two) times daily for 7 days.  14 capsule Eliezer Lofts, FNP   phenazopyridine (PYRIDIUM) 200 MG tablet Take 1 tablet (200 mg total) by mouth 3 (three) times daily for 5 days. 15 tablet Eliezer Lofts, FNP      PDMP not reviewed this encounter.   Eliezer Lofts, FNP 09/29/21 1325

## 2021-09-29 NOTE — Discharge Instructions (Addendum)
Advised patient to take medications as directed with food to completion.  Encouraged patient to increase daily water intake while taking these medications.  Advised we will follow-up with you once urine culture results return.

## 2021-10-01 LAB — URINE CULTURE
MICRO NUMBER:: 12931050
SPECIMEN QUALITY:: ADEQUATE

## 2021-10-27 ENCOUNTER — Encounter: Payer: Self-pay | Admitting: Obstetrics and Gynecology

## 2021-12-05 ENCOUNTER — Telehealth: Payer: Self-pay | Admitting: *Deleted

## 2021-12-05 NOTE — Telephone Encounter (Signed)
PA done via cover my meds for trimethoprim 100 mg tablet. Pending response from [facility].  ?

## 2021-12-13 NOTE — Telephone Encounter (Signed)
Received fax back from [facility] and sent fax back stating Rx does not need prior approval contact pharmacy to update NCD and reprocess claim. I called pharmacy and was informed patient has new insurance and the new insurance medication is approved no PA needed.  ? ?Encounter closed.  ?

## 2022-01-22 ENCOUNTER — Encounter: Payer: Self-pay | Admitting: Obstetrics and Gynecology

## 2022-01-24 ENCOUNTER — Encounter: Payer: Self-pay | Admitting: Obstetrics and Gynecology

## 2022-08-13 NOTE — Progress Notes (Signed)
61 y.o. G6P2002 Married White or Caucasian Not Hispanic or Latino female here for annual exam.  No vaginal bleeding. No dyspareunia. No bowel or bladder c/o.    She saw a Dentist a few years ago for her FH of multiple cancers. Genetic testing wasn't done secondary to expense. TC risk of breast cancer was 6.8%.   She has been on daily antibiotics for UTI prevention for ~3.5 years. No UTI's for almost 2 years. Prior to starting the suppression she was having 3-4 UTI's a year, long term (even prior to menopause). Not related to intercourse. She has seen Urology in the past, negative evaluation.    On Celexa for depression and anxiety, working well.   Patient's last menstrual period was 04/04/2011.          Sexually active: Yes.    The current method of family planning is post menopausal status.    Exercising: No.  The patient does not participate in regular exercise at present. Smoker:  no  Health Maintenance: Pap:06/27/2017 WNL, 06-20-16 WNL NEG HR HPV  History of abnormal Pap:  Yes repeat pap was normal  MMG:  01/21/22 bi-rads 1 neg  BMD:   10/14/2018 WNL Colonoscopy: 2021 small polyp f/u 5 years  TDaP:  08/05/19  Gardasil: n/a   reports that she has never smoked. She has never used smokeless tobacco. She reports current alcohol use. She reports that she does not use drugs. Rare ETOH. She has a new job as a Production designer, theatre/television/film at [employer]. Loves her new job.  She has 2 adult daughters, both live at home, both have their MBA's.    Past Medical History:  Diagnosis Date   Arthritis    Dry eyes    Family history of bladder cancer    Family history of breast cancer    Family history of colon cancer    Family history of liver cancer    Family history of lung cancer    Family history of ovarian cancer    Hypertension    Microscopic hematuria 2012   w/u -    Past Surgical History:  Procedure Laterality Date   COLONOSCOPY     DILATATION & CURETTAGE/HYSTEROSCOPY WITH MYOSURE N/A  08/16/2015   Procedure: DILATATION & CURETTAGE/HYSTEROSCOPY WITH MYOSURE;  Surgeon: Romualdo Bolk, MD;  Location: [facility];  Service: Gynecology;  Laterality: N/A;   TOTAL HIP ARTHROPLASTY Bilateral    anterior procedure   WISDOM TOOTH EXTRACTION      Current Outpatient Medications  Medication Sig Dispense Refill   atorvastatin (LIPITOR) 20 MG tablet Take 20 mg by mouth daily.     citalopram (CELEXA) 20 MG tablet Take 1 tablet (20 mg total) by mouth daily. 90 tablet 4   cycloSPORINE (RESTASIS) 0.05 % ophthalmic emulsion Place 1 drop into both eyes 2 (two) times daily.     Glucosamine-Chondroitin 500-400 MG CAPS TAKE 3 CAPSULES EVERY DAY     latanoprost (XALATAN) 0.005 % ophthalmic solution Place 1 drop into both eyes at bedtime.     losartan (COZAAR) 50 MG tablet Take 50 mg by mouth daily.     OVER THE COUNTER MEDICATION Take 2 capsules by mouth daily. Ultra Flora Balance Probiotic     trimethoprim (TRIMPEX) 100 MG tablet Take 0.5 tablets (50 mg total) by mouth daily. 45 tablet 4   No current facility-administered medications for this visit.    Family History  Problem Relation Age of Onset   Bladder Cancer Mother 82  Lung cancer Father 81       former smoker   Ovarian cancer Cousin 41       paternal #1   Colon cancer Cousin 32       paternal #2   Breast cancer Cousin        maternal, dx. early 5s   Liver cancer Maternal Grandmother 87   Lung cancer Maternal Grandfather 61       smoker   Cancer Paternal Aunt        unknown type, dx. in her 11s   Cancer Paternal Uncle        unknown type, dx. in his 29s   Heart attack Paternal Grandmother    Stroke Paternal Grandfather    Aneurysm Cousin        4 paternal cousins, brain    Review of Systems  All other systems reviewed and are negative.   Exam:   BP 122/80   Pulse 66   Ht 5' 4.5" (1.638 m)   Wt 158 lb (71.7 kg)   LMP 04/04/2011   SpO2 100%   BMI 26.70 kg/m   Weight change: @WEIGHTCHANGE @ Height:   Height:  5' 4.5" (163.8 cm)  Ht Readings from Last 3 Encounters:  08/23/22 5' 4.5" (1.638 m)  09/29/21 5\' 5"  (1.651 m)  08/17/21 5\' 5"  (1.651 m)    General appearance: alert, cooperative and appears stated age Head: Normocephalic, without obvious abnormality, atraumatic Neck: no adenopathy, supple, symmetrical, trachea midline and thyroid normal to inspection and palpation Lungs: clear to auscultation bilaterally Cardiovascular: regular rate and rhythm Breasts: normal appearance, no masses or tenderness Abdomen: soft, non-tender; non distended,  no masses,  no organomegaly Extremities: extremities normal, atraumatic, no cyanosis or edema Skin: Skin color, texture, turgor normal. No rashes or lesions Lymph nodes: Cervical, supraclavicular, and axillary nodes normal. No abnormal inguinal nodes palpated Neurologic: Grossly normal   Pelvic: External genitalia:  no lesions              Urethra:  normal appearing urethra with no masses, tenderness or lesions              Bartholins and Skenes: normal                 Vagina: normal appearing vagina with normal color and discharge, no lesions              Cervix: no lesions               Bimanual Exam:  Uterus:  normal size, contour, position, consistency, mobility, non-tender              Adnexa: no mass, fullness, tenderness               Rectovaginal: Confirms               Anus:  normal sphincter tone, no lesions  Gae Dry, CMA chaperoned for the exam.      1. Well woman exam Discussed breast self exam Discussed calcium and vit D intake Mammogram in 5/23 Colonoscopy and DEXA UTD  2. Screening for cervical cancer - Cytology - PAP  3. History of depression Well controlled - citalopram (CELEXA) 20 MG tablet; Take 1 tablet (20 mg total) by mouth daily.  Dispense: 90 each; Refill: 4  4. History of anxiety Well controlled - citalopram (CELEXA) 20 MG tablet; Take 1 tablet (20 mg total) by mouth daily.  Dispense: 90 each; Refill:  4  5. History of recurrent UTI (urinary tract infection) She has been on daily antibiotics for 3.5 years. Will try going off and see how she does.

## 2022-08-22 ENCOUNTER — Ambulatory Visit: Payer: BC Managed Care – PPO | Admitting: Obstetrics and Gynecology

## 2022-08-22 ENCOUNTER — Other Ambulatory Visit: Payer: Self-pay | Admitting: Obstetrics and Gynecology

## 2022-08-22 DIAGNOSIS — Z87440 Personal history of urinary (tract) infections: Secondary | ICD-10-CM

## 2022-08-23 ENCOUNTER — Ambulatory Visit (INDEPENDENT_AMBULATORY_CARE_PROVIDER_SITE_OTHER): Payer: 59 | Admitting: Obstetrics and Gynecology

## 2022-08-23 ENCOUNTER — Other Ambulatory Visit (HOSPITAL_COMMUNITY)
Admission: RE | Admit: 2022-08-23 | Discharge: 2022-08-23 | Disposition: A | Discharge status: Home / Self Care | Payer: 59 | Source: Ambulatory Visit | Attending: Obstetrics and Gynecology | Admitting: Obstetrics and Gynecology

## 2022-08-23 ENCOUNTER — Encounter: Payer: Self-pay | Admitting: Obstetrics and Gynecology

## 2022-08-23 VITALS — BP 122/80 | HR 66 | Ht 64.5 in | Wt 158.0 lb

## 2022-08-23 DIAGNOSIS — Z124 Encounter for screening for malignant neoplasm of cervix: Secondary | ICD-10-CM

## 2022-08-23 DIAGNOSIS — Z01419 Encounter for gynecological examination (general) (routine) without abnormal findings: Secondary | ICD-10-CM | POA: Diagnosis not present

## 2022-08-23 DIAGNOSIS — Z13228 Encounter for screening for other metabolic disorders: Secondary | ICD-10-CM | POA: Insufficient documentation

## 2022-08-23 DIAGNOSIS — Z87440 Personal history of urinary (tract) infections: Secondary | ICD-10-CM

## 2022-08-23 DIAGNOSIS — Z8659 Personal history of other mental and behavioral disorders: Secondary | ICD-10-CM | POA: Diagnosis not present

## 2022-08-23 MED ORDER — CITALOPRAM HYDROBROMIDE 20 MG PO TABS: 20.0000 mg | ORAL_TABLET | Freq: Every day | ORAL | 4 refills | Status: DC

## 2022-08-23 NOTE — Patient Instructions (Signed)

## 2022-08-28 LAB — CYTOLOGY - PAP
Comment: NEGATIVE
Diagnosis: NEGATIVE
High risk HPV: NEGATIVE

## 2022-09-11 ENCOUNTER — Other Ambulatory Visit: Payer: Self-pay | Admitting: Obstetrics and Gynecology

## 2022-09-11 DIAGNOSIS — Z8659 Personal history of other mental and behavioral disorders: Secondary | ICD-10-CM

## 2022-09-11 NOTE — Telephone Encounter (Signed)
Rx sent on 08/23/2022 for #90 w/ 4 refills to [facility].  Called pt to confirm that was where she wanted it to go and she confirmed that it was sent to correct location. Will refuse this request.

## 2022-09-11 NOTE — Addendum Note (Signed)
Addended by: Ramond Craver on: 09/11/2022 02:27 PM   Modules accepted: Orders

## 2022-09-11 NOTE — Telephone Encounter (Signed)
Patient called today and said that [facility] had contacted her and they are unable to fill her Citalopram Rx because they are unable to get the medication at this time.  Patient requested it be resent to her local pharmacy. [place].

## 2022-09-12 MED ORDER — CITALOPRAM HYDROBROMIDE 20 MG PO TABS: 20.0000 mg | ORAL_TABLET | Freq: Every day | ORAL | 4 refills | Status: AC

## 2022-12-25 ENCOUNTER — Encounter: Payer: Self-pay | Admitting: Obstetrics and Gynecology

## 2022-12-26 IMAGING — MG MM DIGITAL SCREENING BILAT W/ TOMO AND CAD
8 series · 8 of 24 positions shown · non-contrast
Comparison: Previous exam(s).

CLINICAL DATA: Screening.

EXAM:
DIGITAL SCREENING BILATERAL MAMMOGRAM WITH TOMOSYNTHESIS AND CAD
TECHNIQUE: Bilateral screening digital craniocaudal and mediolateral oblique
mammograms were obtained. Bilateral screening digital breast
tomosynthesis was performed. The images were evaluated with
computer-aided detection.

[L CC synth-2D]
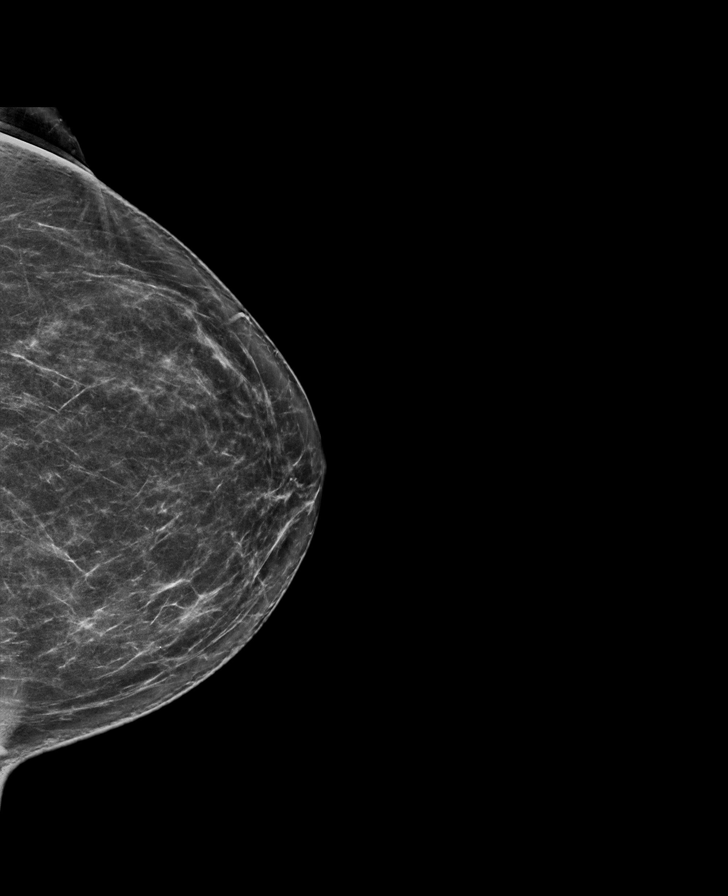

[R MLO synth-2D]
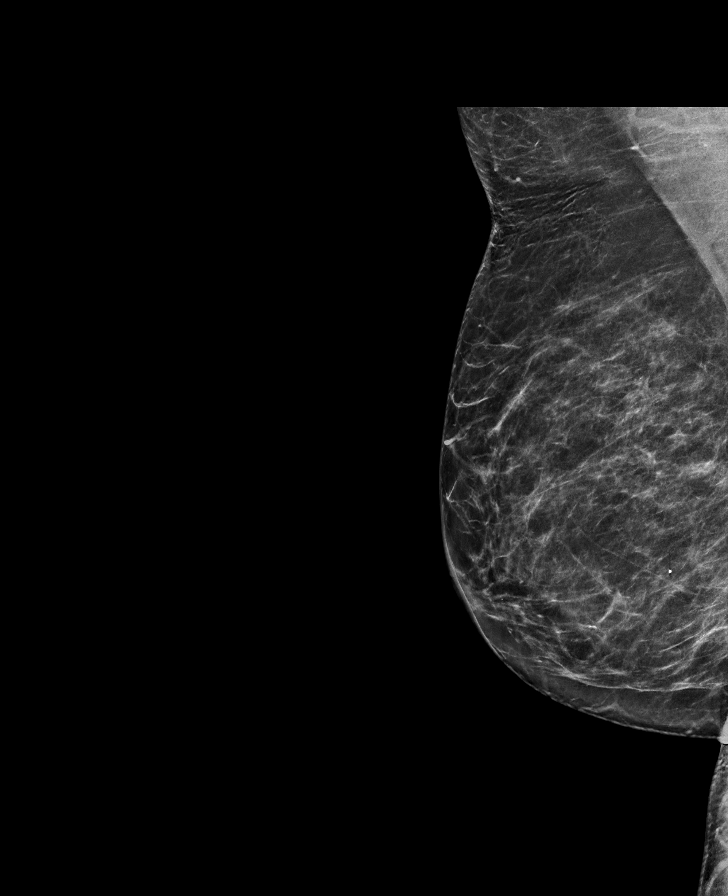

[R CC synth-2D]
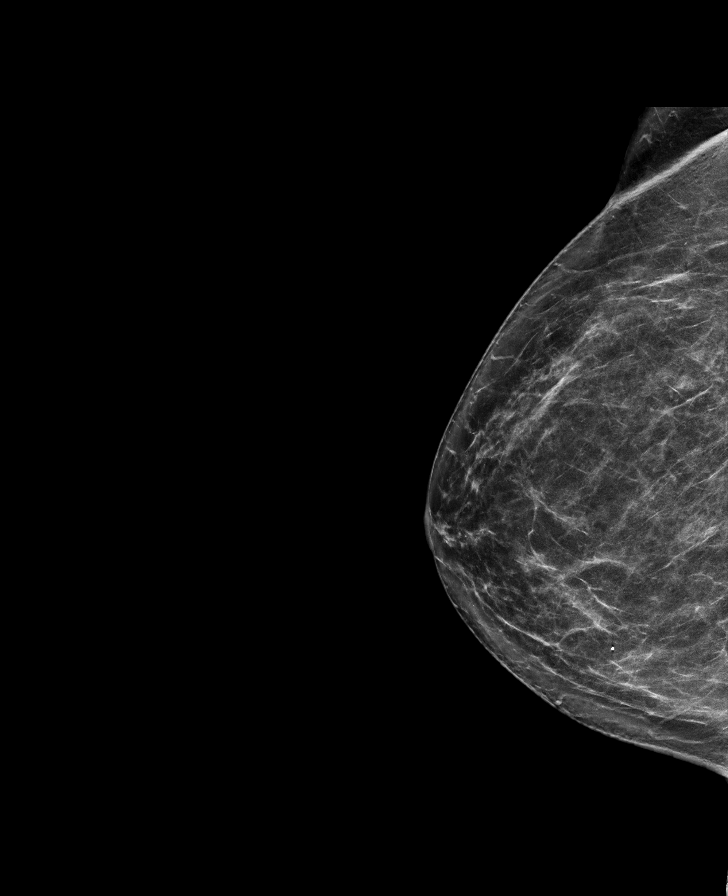

[L MLO synth-2D]
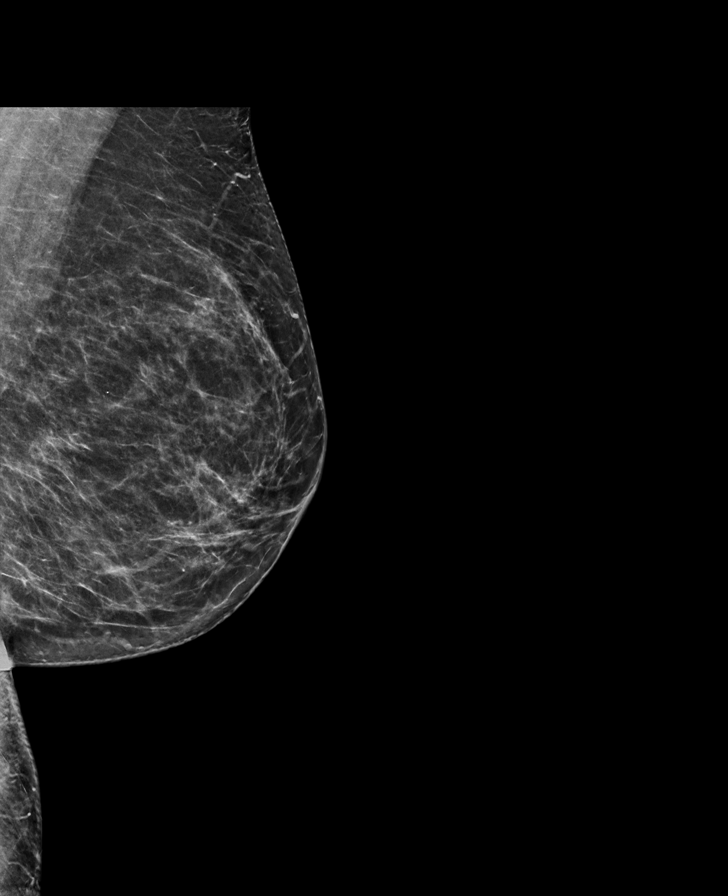

[L CC tomo · tomo slice 36/71.0]
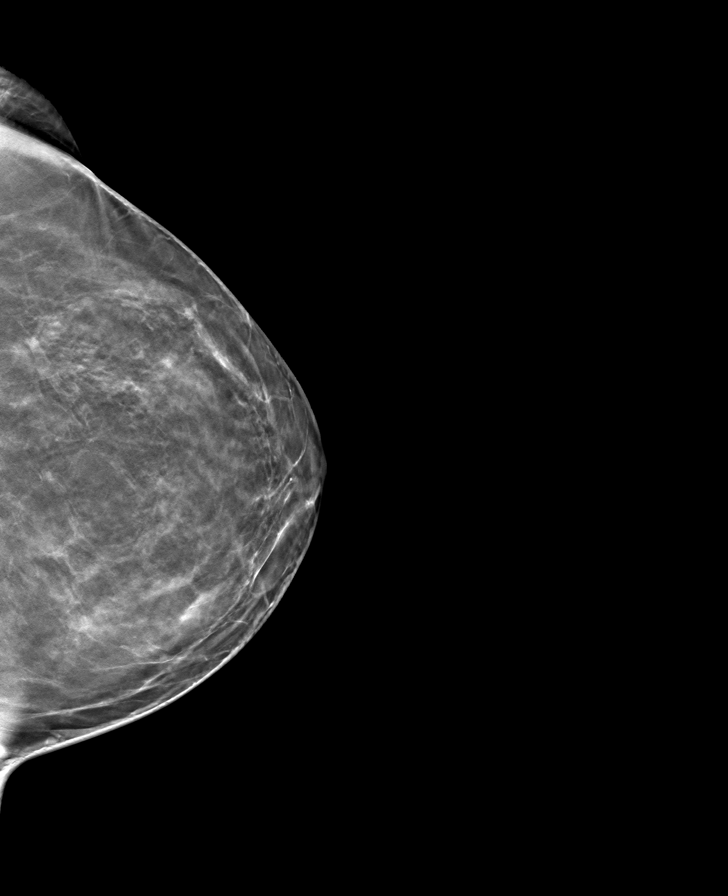

[L MLO tomo · tomo slice 35/68.0]
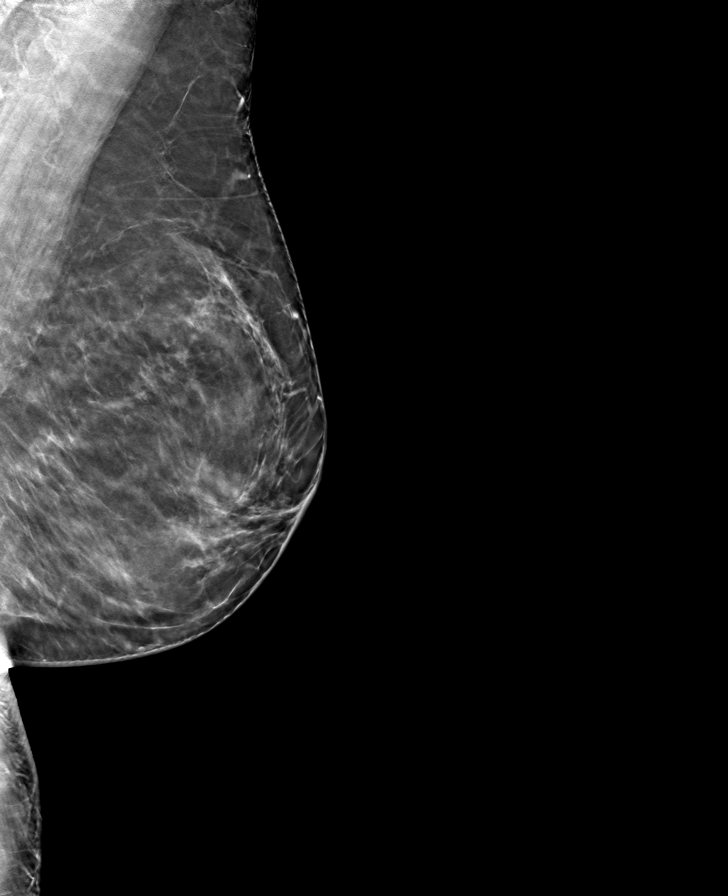

[R CC tomo · tomo slice 37/72.0]
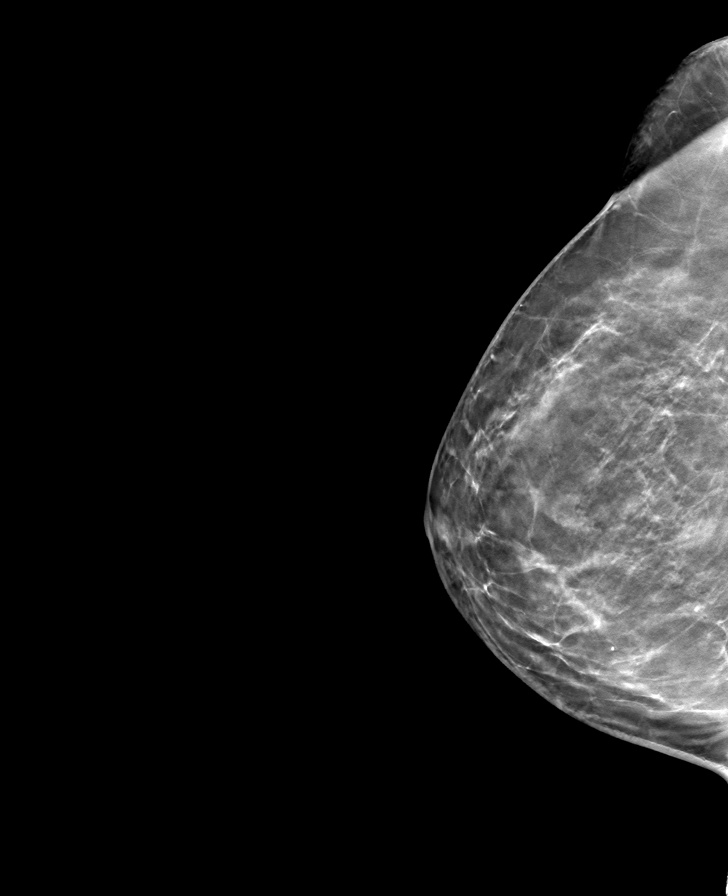

[R MLO tomo · tomo slice 36/71.0]
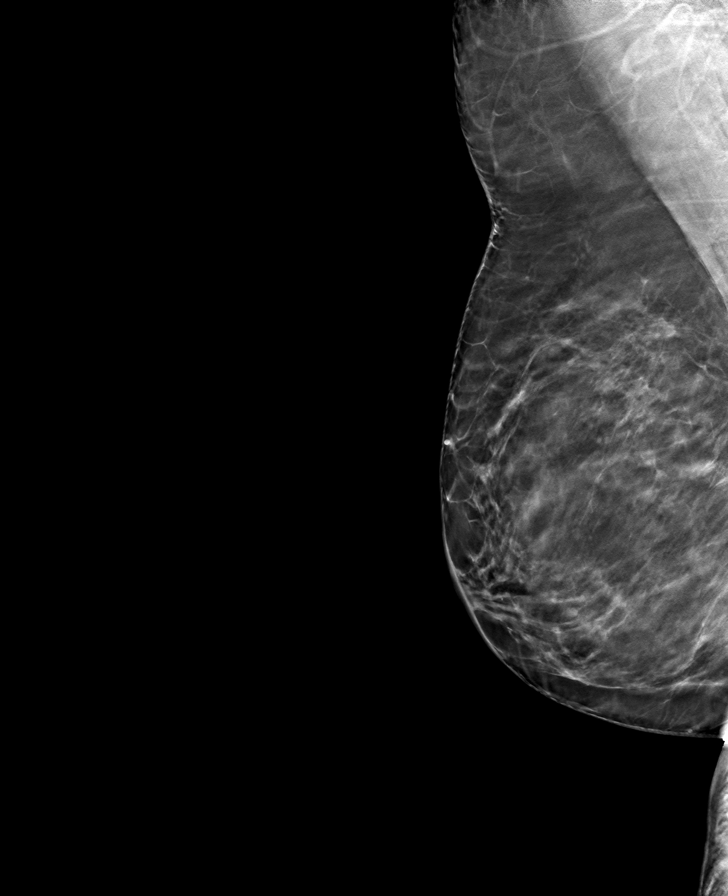

[8 of 24 positions shown; findings below may reference images not displayed]

ACR Breast Density Category b: There are scattered areas of
fibroglandular density.
FINDINGS: There are no findings suspicious for malignancy. The images were
evaluated with computer-aided detection.
IMPRESSION: No mammographic evidence of malignancy. A result letter of this
screening mammogram will be mailed directly to the patient.

RECOMMENDATION:
Screening mammogram in one year. (Code:WJ-I-BG6)

BI-RADS CATEGORY  1: Negative.

## 2023-05-19 ENCOUNTER — Other Ambulatory Visit: Payer: Self-pay

## 2023-05-19 ENCOUNTER — Ambulatory Visit
Admission: EM | Admit: 2023-05-19 | Discharge: 2023-05-19 | Disposition: A | Discharge status: Home / Self Care | Payer: 59 | Attending: Family Medicine | Admitting: Family Medicine

## 2023-05-19 DIAGNOSIS — N390 Urinary tract infection, site not specified: Secondary | ICD-10-CM | POA: Diagnosis not present

## 2023-05-19 DIAGNOSIS — B9629 Other Escherichia coli [E. coli] as the cause of diseases classified elsewhere: Secondary | ICD-10-CM | POA: Diagnosis not present

## 2023-05-19 DIAGNOSIS — Z1612 Extended spectrum beta lactamase (ESBL) resistance: Secondary | ICD-10-CM

## 2023-05-19 LAB — POCT URINALYSIS DIP (MANUAL ENTRY)
Glucose, UA: NEGATIVE mg/dL
Nitrite, UA: NEGATIVE
Protein Ur, POC: >=300 mg/dL — AB
Spec Grav, UA: >=1.03 — AB (ref 1.010–1.025)
Urobilinogen, UA: 0.2 U/dL
pH, UA: 6 (ref 5.0–8.0)

## 2023-05-19 NOTE — ED Notes (Signed)
Report called to charge nurse at [facility], Inetta Fermo RN

## 2023-05-19 NOTE — Discharge Instructions (Signed)
Need to go to ER for IV antibiotic therapy  Failed 10 days of macrodantin

## 2023-05-19 NOTE — ED Triage Notes (Addendum)
Burning urination, urgency, bladder discomfort since this morning. Before she went on vacation she had a UTI and was on macrobid for 10 days. Finished abx 10 days ago. On trimethoprim.

## 2023-05-19 NOTE — ED Provider Notes (Signed)
[facility]    CSN: [id number] Arrival date & time: 05/19/23  1440      History   Chief Complaint Chief Complaint  Patient presents with   Dysuria    HPI Kath[facility] is a 62 y.o. female.   HPI  Patient has a history of recurring urinary tract infections She has been seen by urology on 2 different occasions, states that her workup was negative She has been under the care of her OB/GYN doctor and was taking long-term trimethoprim Her family doctor stopped this medication and then she got a urinary tract infection again She states that she was treated with Cipro, and then the urinary infection came right back.  She was treated with Macrodantin for 10 days, as soon as the Macrodantin ended the urinary symptoms came back Fortunately her urine was cultured at her last medical visit prior to starting the Macrodantin.  I have a copy of the urine culture and sensitivity report.  She has an ESBL E. 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 Currently has dysuria and hematuria.  She does not have any flank pain, fever or chills, nausea or vomiting to suggest kidney involvement or impending sepsis  Past Medical History:  Diagnosis Date   Arthritis    Dry eyes    Family history of bladder cancer    Family history of breast cancer    Family history of colon cancer    Family history of liver cancer    Family history of lung cancer    Family history of ovarian cancer    Hypertension    Microscopic hematuria 2012   w/u -    Patient Active Problem List   Diagnosis Date Noted   Encounter for screening for other metabolic disorders 08/23/2022   Family history of breast cancer    Family history of colon cancer    Family history of ovarian cancer    Family history of lung cancer    Family history of bladder cancer    Family history of liver cancer    Status post bilateral hip replacements 06/21/2018   Other and unspecified hyperlipidemia 05/27/2013    Unspecified essential hypertension 05/15/2013   Benign microscopic hematuria 05/15/2013    Past Surgical History:  Procedure Laterality Date   COLONOSCOPY     DILATATION & CURETTAGE/HYSTEROSCOPY WITH MYOSURE N/A 08/16/2015   Procedure: DILATATION & CURETTAGE/HYSTEROSCOPY WITH MYOSURE;  Surgeon: Romualdo Bolk, MD;  Location: [facility];  Service: Gynecology;  Laterality: N/A;   TOTAL HIP ARTHROPLASTY Bilateral    anterior procedure   WISDOM TOOTH EXTRACTION      OB History     Gravida  2   Para  2   Term  2   Preterm      AB      Living  2      SAB      IAB      Ectopic      Multiple      Live Births  2            Home Medications    Prior to Admission medications   Medication Sig Start Date End Date Taking? Authorizing Provider  atorvastatin (LIPITOR) 20 MG tablet Take 20 mg by mouth daily. 07/13/21   [provider]  citalopram (CELEXA) 20 MG tablet Take 1 tablet (20 mg total) by mouth daily. 09/12/22   Romualdo Bolk, MD  cycloSPORINE (RESTASIS) 0.05 % ophthalmic emulsion Place 1 drop  into both eyes 2 (two) times daily.    [provider]  Glucosamine-Chondroitin 500-400 MG CAPS TAKE 3 CAPSULES EVERY DAY 03/12/17   [provider]  latanoprost (XALATAN) 0.005 % ophthalmic solution Place 1 drop into both eyes at bedtime. 06/29/19   [provider]  losartan (COZAAR) 50 MG tablet Take 50 mg by mouth daily.    [provider]  OVER THE COUNTER MEDICATION Take 2 capsules by mouth daily. Ultra Flora Balance Probiotic    [provider]  trimethoprim (TRIMPEX) 100 MG tablet Take 0.5 tablets (50 mg total) by mouth daily. 08/17/21   Romualdo Bolk, MD    Family History Family History  Problem Relation Age of Onset   Bladder Cancer Mother 62   Lung cancer Father 49       former smoker   Ovarian cancer Cousin 35       paternal #1   Colon cancer Cousin 40       paternal #2   Breast cancer  Cousin        maternal, dx. early 49s   Liver cancer Maternal Grandmother 57   Lung cancer Maternal Grandfather 38       smoker   Cancer Paternal Aunt        unknown type, dx. in her 36s   Cancer Paternal Uncle        unknown type, dx. in his 44s   Heart attack Paternal Grandmother    Stroke Paternal Grandfather    Aneurysm Cousin        4 paternal cousins, brain    Social History Social History   Tobacco Use   Smoking status: Never   Smokeless tobacco: Never  Vaping Use   Vaping status: Never Used  Substance Use Topics   Alcohol use: Yes    Alcohol/week: 0.0 standard drinks of alcohol    Comment: Occassionally   Drug use: No     Allergies   Sulfa antibiotics   Review of Systems Review of Systems See HPI  Physical Exam Triage Vital Signs ED Triage Vitals  Encounter Vitals Group     BP 05/19/23 1443 123/73     Systolic BP Percentile --      Diastolic BP Percentile --      Pulse Rate 05/19/23 1443 98     Resp 05/19/23 1443 16     Temp 05/19/23 1443 98.5 F (36.9 C)     Temp Source 05/19/23 1443 Oral     SpO2 05/19/23 1443 99 %     Weight --      Height --      Head Circumference --      Peak Flow --      Pain Score 05/19/23 1451 8     Pain Loc --      Pain Education --      Exclude from Growth Chart --    No data found.  Updated Vital Signs BP 123/73 (BP Location: Left Arm)   Pulse 98   Temp 98.5 F (36.9 C) (Oral)   Resp 16   LMP 04/04/2011   SpO2 99%      Physical Exam Constitutional:      General: She is not in acute distress.    Appearance: Normal appearance. She is well-developed. She is not ill-appearing.  HENT:     Head: Normocephalic and atraumatic.  Eyes:     Conjunctiva/sclera: Conjunctivae normal.     Pupils: Pupils are equal,  round, and reactive to light.  Cardiovascular:     Rate and Rhythm: Normal rate.  Pulmonary:     Effort: Pulmonary effort is normal. No respiratory distress.  Abdominal:     General: There is no  distension.     Palpations: Abdomen is soft.     Tenderness: There is no right CVA tenderness or left CVA tenderness.  Musculoskeletal:        General: Normal range of motion.     Cervical back: Normal range of motion.  Skin:    General: Skin is warm and dry.  Neurological:     Mental Status: She is alert.      UC Treatments / Results  Labs (all labs ordered are listed, but only abnormal results are displayed) Labs Reviewed  POCT URINALYSIS DIP (MANUAL ENTRY) - Abnormal; Notable for the following components:      Result Value   Clarity, UA cloudy (*)    Bilirubin, UA moderate (*)    Ketones, POC UA trace (5) (*)    Spec Grav, UA >=1.030 (*)    Blood, UA large (*)    Protein Ur, POC >=300 (*)    Leukocytes, UA Small (1+) (*)    All other components within normal limits  URINE CULTURE    EKG   Radiology No results found.  Procedures Procedures (including critical care time)  Medications Ordered in UC Medications - No data to display  Initial Impression / Assessment and Plan / UC Course  I have reviewed the triage vital signs and the nursing notes.  Pertinent labs & imaging results that were available during my care of the patient were reviewed by me and considered in my medical decision making (see chart for details).     Review of the culture and sensitivity report indicates that this bacteria is sensitive only to handful of IV antibiotics.  It has an intermediate sensitivity to Macrodantin, however she just finished a 10-day course and it failed to treat her symptoms. She is referred to the emergency room for higher level of care.  The ER is called with report Final Clinical Impressions(s) / UC Diagnoses   Final diagnoses:  Recurrent urinary tract infection  Urinary tract infection due to extended-spectrum beta lactamase (ESBL) producing Escherichia coli     Discharge Instructions      Need to go to ER for IV antibiotic therapy  Failed 10 days of  macrodantin   ED Prescriptions   None    PDMP not reviewed this encounter.   Eustace Moore, MD 05/19/23 1524

## 2023-09-15 NOTE — Progress Notes (Signed)
ANNUAL EXAM Patient name: [facility] MRN [id number]  Date of birth: 01-05-1961 Chief Complaint:   No chief complaint on file.  History of Present Illness:   Kathryn Odom is a 63 y.o. G41P2002 female being seen today for a routine annual exam.   Current concerns:  Discussed the use of AI scribe software for clinical note transcription with the patient, who gave verbal consent to proceed.  The patient, with a history of recurrent urinary tract infections, presents for a routine check-up. She reports no current concerns or issues. She has been under the care of Dr. Ashley Royalty in [facility] for her recurrent UTIs and has been on a regimen by Dr. Ashley Royalty and she reports no infections since starting this regimen and is pleased with the results. She also mentions using vaginal estrogen as part of her treatment plan.  In addition to her UTI history, the patient has a history of normal Pap smears, with the last one being normal in December 2023. She is due for a mammogram, which she gets done at [facility] in [place]. She also mentions a previous benign polyp that was removed following an episode of vaginal bleeding.     Per Dr. Salli Quarry note from 2023:  She saw a genetic counselor a few years ago for her FH of multiple cancers. Genetic testing wasn't done secondary to expense. TC risk of breast cancer was 6.8%.   Patient's last menstrual period was 04/04/2011.  Last MXR: 01/2023. [facility] [address] [facility]  Last Pap/Pap History:  2014: pap/hpv wnl 2017: Pap/hpv wnl 06/2017: pap normal 08/2022: pap/hpv wnl  Review of Systems:   Pertinent items are noted in HPI Denies any headaches, blurred vision, fatigue, shortness of breath, chest pain, abdominal pain, abnormal vaginal discharge/itching/odor/irritation, problems with periods, bowel movements, urination, or intercourse unless otherwise stated above.  Pertinent History Reviewed:  Reviewed past medical,surgical, social and family history.   Reviewed problem list, medications and allergies. Physical Assessment:   Vitals:   09/19/23 0806  BP: 138/75  Pulse: 91  Weight: 163 lb (73.9 kg)  Height: 5' 4.5" (1.638 m)  Body mass index is 27.55 kg/m.   Physical Examination:  General appearance - well appearing, and in no distress Mental status - alert, oriented to person, place, and time Psych:  She has a normal mood and affect Skin - warm and dry, normal color, no suspicious lesions noted Chest - effort normal Heart - normal rate  Breasts - breasts appear normal, no suspicious masses, no skin or nipple changes or axillary nodes Abdomen - soft, nontender, nondistended, no masses or organomegaly Pelvic -  VULVA: normal appearing vulva with no masses, tenderness or lesions  VAGINA: normal appearing vagina with normal color and discharge, no lesions  CERVIX: normal appearing cervix without discharge or lesions, no CMT UTERUS: uterus is felt to be normal size, shape, consistency and nontender  ADNEXA: No adnexal masses or tenderness noted. Extremities:  No swelling or varicosities noted  Chaperone present for exam  No results found for this or any previous visit (from the past 24 hours).  Assessment & Plan:  Diagnoses and all orders for this visit:  Encounter for annual routine gynecological examination  - Cervical cancer screening: Discussed guidelines. Pap with HPV up to date - Breast Health: Encouraged self breast awareness/SBE. Discussed limits of clinical breast exam for detecting breast cancer. Discussed importance of annual MXR.  She goes annually and has her appt made.  - Climacteric/Sexual health: Reviewed typical and atypical  symptoms of menopause/peri-menopause. Discussed PMB and to call if any amount of spotting.  - Colonoscopy:  2021 - small polyp - F/U 12 months and prn    No orders of the defined types were placed in this encounter.   Meds: No orders of the defined types were placed in this  encounter.   Follow-up: Return in about 2 years (around 09/18/2025) for annual.  Milas Hock, MD 09/19/2023 8:15 AM

## 2023-09-19 ENCOUNTER — Encounter: Payer: Self-pay | Admitting: Obstetrics and Gynecology

## 2023-09-19 ENCOUNTER — Ambulatory Visit: Payer: 59 | Admitting: Obstetrics and Gynecology

## 2023-09-19 VITALS — BP 138/75 | HR 91 | Ht 64.5 in | Wt 163.0 lb

## 2023-09-19 DIAGNOSIS — Z01419 Encounter for gynecological examination (general) (routine) without abnormal findings: Secondary | ICD-10-CM

## 2023-09-19 DIAGNOSIS — N390 Urinary tract infection, site not specified: Secondary | ICD-10-CM
